# Patient Record
Sex: Male | Born: 1960 | Race: White | Hispanic: No | Marital: Married | Smoking: Former smoker
Health system: Southern US, Community
[De-identification: ages and names within clinical notes are randomized; demographics above are authoritative.]

## PROBLEM LIST (undated history)

## (undated) DIAGNOSIS — Z8249 Family history of ischemic heart disease and other diseases of the circulatory system: Secondary | ICD-10-CM

## (undated) DIAGNOSIS — G4730 Sleep apnea, unspecified: Secondary | ICD-10-CM

## (undated) DIAGNOSIS — Z87442 Personal history of urinary calculi: Secondary | ICD-10-CM

## (undated) DIAGNOSIS — E785 Hyperlipidemia, unspecified: Secondary | ICD-10-CM

## (undated) DIAGNOSIS — E8881 Metabolic syndrome: Secondary | ICD-10-CM

## (undated) DIAGNOSIS — G4733 Obstructive sleep apnea (adult) (pediatric): Secondary | ICD-10-CM

## (undated) DIAGNOSIS — R7302 Impaired glucose tolerance (oral): Secondary | ICD-10-CM

## (undated) DIAGNOSIS — Z87891 Personal history of nicotine dependence: Secondary | ICD-10-CM

## (undated) DIAGNOSIS — R55 Syncope and collapse: Secondary | ICD-10-CM

## (undated) DIAGNOSIS — I2510 Atherosclerotic heart disease of native coronary artery without angina pectoris: Secondary | ICD-10-CM

## (undated) DIAGNOSIS — N200 Calculus of kidney: Secondary | ICD-10-CM

## (undated) DIAGNOSIS — E669 Obesity, unspecified: Secondary | ICD-10-CM

## (undated) DIAGNOSIS — F17210 Nicotine dependence, cigarettes, uncomplicated: Secondary | ICD-10-CM

## (undated) DIAGNOSIS — I10 Essential (primary) hypertension: Secondary | ICD-10-CM

## (undated) DIAGNOSIS — I700 Atherosclerosis of aorta: Secondary | ICD-10-CM

## (undated) DIAGNOSIS — R918 Other nonspecific abnormal finding of lung field: Secondary | ICD-10-CM

## (undated) DIAGNOSIS — J45909 Unspecified asthma, uncomplicated: Secondary | ICD-10-CM

## (undated) HISTORY — PX: KNEE SURGERY: SHX244

## (undated) HISTORY — DX: Hyperlipidemia, unspecified: E78.5

## (undated) HISTORY — DX: Obesity, unspecified: E66.9

## (undated) HISTORY — DX: Syncope and collapse: R55

## (undated) HISTORY — DX: Family history of ischemic heart disease and other diseases of the circulatory system: Z82.49

## (undated) HISTORY — DX: Nicotine dependence, cigarettes, uncomplicated: F17.210

## (undated) HISTORY — DX: Impaired glucose tolerance (oral): R73.02

## (undated) HISTORY — DX: Personal history of nicotine dependence: Z87.891

## (undated) HISTORY — DX: Atherosclerotic heart disease of native coronary artery without angina pectoris: I25.10

## (undated) HISTORY — DX: Other nonspecific abnormal finding of lung field: R91.8

## (undated) HISTORY — DX: Sleep apnea, unspecified: G47.30

## (undated) HISTORY — DX: Obstructive sleep apnea (adult) (pediatric): G47.33

## (undated) HISTORY — DX: Unspecified asthma, uncomplicated: J45.909

## (undated) HISTORY — DX: Metabolic syndrome: E88.810

## (undated) HISTORY — DX: Essential (primary) hypertension: I10

## (undated) HISTORY — DX: Atherosclerosis of aorta: I70.0

## (undated) HISTORY — DX: Calculus of kidney: N20.0

## (undated) HISTORY — DX: Metabolic syndrome: E88.81

## (undated) HISTORY — PX: COLONOSCOPY: SHX174

---

## 2006-02-19 ENCOUNTER — Ambulatory Visit: Payer: Self-pay | Admitting: Family Medicine

## 2012-06-10 ENCOUNTER — Ambulatory Visit: Payer: Self-pay | Admitting: Gastroenterology

## 2013-03-27 HISTORY — PX: OTHER SURGICAL HISTORY: SHX169

## 2013-04-01 ENCOUNTER — Encounter: Payer: Self-pay | Admitting: *Deleted

## 2013-04-02 ENCOUNTER — Ambulatory Visit (INDEPENDENT_AMBULATORY_CARE_PROVIDER_SITE_OTHER): Payer: No Typology Code available for payment source | Admitting: Cardiology

## 2013-04-02 ENCOUNTER — Encounter (INDEPENDENT_AMBULATORY_CARE_PROVIDER_SITE_OTHER): Payer: Self-pay

## 2013-04-02 ENCOUNTER — Encounter: Payer: Self-pay | Admitting: Cardiology

## 2013-04-02 VITALS — BP 147/102 | HR 60 | Ht 70.0 in | Wt 244.0 lb

## 2013-04-02 DIAGNOSIS — Z8249 Family history of ischemic heart disease and other diseases of the circulatory system: Secondary | ICD-10-CM | POA: Insufficient documentation

## 2013-04-02 DIAGNOSIS — I10 Essential (primary) hypertension: Secondary | ICD-10-CM

## 2013-04-02 DIAGNOSIS — E785 Hyperlipidemia, unspecified: Secondary | ICD-10-CM | POA: Insufficient documentation

## 2013-04-02 DIAGNOSIS — R0989 Other specified symptoms and signs involving the circulatory and respiratory systems: Secondary | ICD-10-CM

## 2013-04-02 DIAGNOSIS — F17200 Nicotine dependence, unspecified, uncomplicated: Secondary | ICD-10-CM

## 2013-04-02 DIAGNOSIS — E669 Obesity, unspecified: Secondary | ICD-10-CM

## 2013-04-02 DIAGNOSIS — E8881 Metabolic syndrome: Secondary | ICD-10-CM | POA: Insufficient documentation

## 2013-04-02 DIAGNOSIS — R55 Syncope and collapse: Secondary | ICD-10-CM

## 2013-04-02 DIAGNOSIS — Z8679 Personal history of other diseases of the circulatory system: Secondary | ICD-10-CM | POA: Insufficient documentation

## 2013-04-02 DIAGNOSIS — R0609 Other forms of dyspnea: Secondary | ICD-10-CM

## 2013-04-02 DIAGNOSIS — F17210 Nicotine dependence, cigarettes, uncomplicated: Secondary | ICD-10-CM

## 2013-04-02 HISTORY — DX: Hyperlipidemia, unspecified: E78.5

## 2013-04-02 HISTORY — DX: Nicotine dependence, cigarettes, uncomplicated: F17.210

## 2013-04-02 HISTORY — DX: Family history of ischemic heart disease and other diseases of the circulatory system: Z82.49

## 2013-04-02 HISTORY — DX: Obesity, unspecified: E66.9

## 2013-04-02 NOTE — Progress Notes (Signed)
PATIENT: Edward Bean MRN: [id number] DOB: [birth year] PCP: Golden Pop, MD  Clinic Note: Chief Complaint  Patient presents with  . other    Ref by Jeananne Rama due to syncope c/o sob with exertion and dizziness. Meds reviewed verbally with pt.,   HPI: Edward Bean is a 53 y.o. male with a PMH family history of premature CAD with both parents having a heart attack at age 35) below who presents today for evaluation of an episode of syncope.   Interval History: At baseline, he is relatively asymptomatic with no symptoms whatsoever of exertional chest pressure chest tightness. No palpitations or lightheadedness, dizziness loses her near-syncope. No PND, orthopnea edema. He was in his regular state of health until Saturday evening (January 3) when HEENT his wife were at a friend's house celebrating all at Christmas. His celebration involved like hors d'oeuvres and he noted having at least 3 beers and then 3 relatively stiff mixed drinks. He also does not having really have that much to drink as far as regular nonalcoholic beverages that day. He initially conferred that this is not a significant amount on-call for him, but his wife argue did it was more than usual, and they did not eat that much. After several hours of festivities, he began feeling flushed, and hot. He said he just did not feel right all of the dizzy. He got up to go to the bathroom to splash water on his face which initially made it feel better but the a wave of flushing and an queasiness to her him so walked outside for fresh air and initially felt better with a fresh air but then when she walked back inside when he was on he felt "foggy and like he was walking with vision." Before he realized it he falls floor. His wife described him being totally unresponsive for several minutes. She could not tell if his breathing, and said he looked pale and it shouldn't. After several minutes of difficult to arouse, he initially came back around. They  put it inside the friend's house which is an unfamiliar environment grams was initially confused but then quickly cannulated denies any postictal type symptoms. He then asked to sit up, and upon doing so again felt queasy and a little been nauseated so he lay back down and proceeded to have small amount of throwing up mostly liquids. Shortly after that he passed out again this time for much less time. During the first event EMS was called, and they arrived shortly after he came back around the second time. He describes feeling like he was "gasping for air before his event. "  Upon EMS arrival he was initially noted to have low blood pressures, normal blood sugars. When they help him sit up, he asked for a glass of water and after drinking less water felt much better. He decided not to go to emergency room.  Importantly, at no time during these events did feel chest tightness or pressure, dyspnea or irregular heartbeat/rapid heartbeats. He described it more as a feeling of the world spinning around little bit like back when he was in college and was getting to back to his room, knowing that he was drunk. The world would start spinning around.  Past Medical History  Diagnosis Date  . Sleep apnea   . Syncope   . Asthma     as child  . Obesity (BMI 30-39.9) 04/02/2013  . Smoking greater than 10 pack years 04/02/2013  . Hyperlipidemia LDL goal <130 04/02/2013  .  Family history of premature CAD 04/02/2013  . Hypertension, essential   . OSA (obstructive sleep apnea)     Not currently on CPAP; clinical diagnosis by PCP  . Metabolic syndrome     Obese; HLD (TG 243), Low HDL (34), HTN  . Impaired glucose tolerance in obese     Prior Cardiac Evaluation and Past Surgical History: Past Surgical History  Procedure Laterality Date  . Knee surgery      Extensive L Knee surgery    Allergies  Allergen Reactions  . Shrimp [Shellfish Allergy]     Itching and rash    Current Outpatient Prescriptions    Medication Sig Dispense Refill  . Aspirin-Salicylamide-Caffeine (BC HEADACHE POWDER PO) Take by mouth as needed.       No current facility-administered medications for this visit.    History   Social History Narrative   Married Father of 1 daughter.    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  Prior to that, smoked off & on for ~20+ years.   Drinks occasional social alcohol - moderate; on weekends   Occasionally walks / does light exercise.          family history includes Esophageal cancer in his mother; Heart attack (age of onset: 58) in his father and mother; Hyperlipidemia in his father; Hypertension in his mother; Lung cancer in his father.  ROS: A comprehensive Review of Systems - Negative except Pertinent symptoms in HPI & otherwise noted below. Respiratory ROS: positive for - exertional dyspnea negative for - cough, hemoptysis, orthopnea, pleuritic pain, sputum changes, stridor, tachypnea or wheezing Musculoskeletal ROS: positive for - joint pain, joint stiffness and L> R knee; limits ambulation.     PHYSICAL EXAM BP 147/102  Pulse 60  Ht 5\' 10"  (1.778 m)  Wt 244 lb (110.678 kg)  BMI 35.01 kg/m2 Orthostatic blood pressure checks: Supine: Heart rate 60, blood pressure 136/90 mmHg; sitting: Heart rate 62, blood pressure 131/87 mmHg. Standing heart rate 65, blood pressure 152/96 mmHg --> after 5 minutes: Heart rate 75, blood pressure 136/95 mmHg. ---> Negative orthostasis. General appearance: alert, cooperative, no distress and mildly obese HEENT: [place]/AT, EOMI, MMM, anicteric sclera Neck: no adenopathy, no carotid bruit, no JVD, supple, symmetrical, trachea midline and thyroid not enlarged, symmetric, no tenderness/mass/nodules Lungs: clear to auscultation bilaterally, normal percussion bilaterally and non-labored, good air movement Heart: regular rate and rhythm, S1, S2 normal, no murmur, click, rub or gallop and  normal apical impulse Abdomen: soft, non-tender; bowel sounds normal; no masses,  no organomegaly Extremities: extremities normal, atraumatic, no cyanosis or edema; L knee with multiple surgical scars & significant crepitance Pulses: 2+ and symmetric Neurologic: Alert and oriented X 3, normal strength and tone. Normal symmetric reflexes. Normal coordination and gait  [id number] today: Yes Rate:60 , Rhythm: NSR; Normal EKG EKG from PCP sinus bradycardia, rate 59. Otherwise normal.  Recent Labs: April 01, 2013  UA trace hematuria otherwise normal. CBC normal with WBC 9.6, and H&H was 15.1/40.6 platelets 242  Chemistries: Sodium 141, potassium 4.2, chloride 101, bicarbonate 25, BUN 13, creatinine 0.6, glucose 97; calcium 9.4. LFTs were normal.  Lipid panel: TC 207, TG 235, HDL 34, LDL 126.  TSH 1.76.  ASSESSMENT / PLAN:  Syncope and collapse Overall impression of this event is that he probably had more to do with excessive alcohol intake with not enough food as well as pre-hydration. He describes symptoms that  sound more like dehydration symptoms. I suspect he simply blacked out from alcohol intoxication and dehydration.  Although this is the leading diagnosis, we discussed other potential etiologies. The lack of any significant cardiac findings on exam or ECG was suggested that there is no structural abnormality of the heart. However in a 54 year old gentleman with features that would be consistent with metabolic syndrome and has a significant family history of premature CAD, it is prudent to exclude potentially lethal/potentially ischemic cardiac arrhythmias. Certainly hypomagnesemia could also be a potential etiology for a Ventricular Tachycardia episode of Torsades.  Plan: Treadmill Myoview Stress Test. Probably deserves this factors, and also because of his significant arthritic pains in his left knee, he is not sure he'll be on a make it to target heart rate on the treadmill.  Therefore I would prefer to use a nuclear imaging scan which will allow for the potential to convert to [place].  Hypertension, essential The blood pressure is elevated today. It was last dilated on orthostatic checks. For now I would prefer to wait until we see the results of stress test before making any recommendations for blood pressure regimen. I don't think that a diuretic would be a good idea based on the leading diagnosis for syncope.  Hyperlipidemia LDL goal <130 Currently he he is right borderline between recommendations being LDL less than 130 and LDL less than 100. Depending on the results of this stress test, we may become more vigilant in management of his dyslipidemia. As in the setting of what appears to be metabolic syndrome with impaired glucose intolerance and obesity as well as hypertension and low HDL.  Metabolic syndrome He does meet criteria with truncal obesity, low HDL, high triglycerides and hypertension. He also has reported glucose intolerance by previous labs. Close monitoring and evaluation is very important, I think it for now dietary modification increasing exercise or subluxation of the key complements of his respective modification.  Smoking greater than 10 pack years He says that he has quit smoking as of this weekend, PCO2 [place] without significant cravings. Continue to monitor I congratulated him on his intentions. I offered support and recommendations as far as options using the patches are atrophic cigarettes. He seems to feel like he can do it without adjunctive therapy.  Dyspnea on exertion The one symptom that is a little bit concerning for symptoms prior to this particular syncopal event is the exertional dyspnea. He states that in the past he was diagnosed with COPD but it is not listed as a diagnosis, and a smoking history is not excessive. He doesn't have any exam findings it was suggested either.    Orders Placed This Encounter  Procedures  .  Myocardial Perfusion Imaging    Standing Status: Future     Number of Occurrences:      Standing Expiration Date: 04/02/2014    Scheduling Instructions:     To be performed at [facility]    Order Specific Question:  Where should this test be performed    Answer:  Other    Order Specific Question:  Type of stress    Answer:  Exercise    Order Specific Question:  Patient weight in lbs    Answer:  244  . EKG 12-Lead    Order Specific Question:  Where should this test be performed    Answer:  [facility]   Meds ordered this encounter  Medications  . Aspirin-Salicylamide-Caffeine (BC HEADACHE POWDER PO)    Sig: Take by mouth as  needed.   Followup: 2 weeks.  DAVID W. Ellyn Hack, M.D., M.S. [facility] [address] [place], [place]  [address]  [phone] Pager # [phone]

## 2013-04-02 NOTE — Patient Instructions (Addendum)
Edward Bean  Your caregiver has ordered a Stress Test with nuclear imaging. The purpose of this test is to evaluate the blood supply to your heart muscle. This procedure is referred to as a "Non-Invasive Stress Test." This is because other than having an IV started in your vein, nothing is inserted or "invades" your body. Cardiac stress tests are done to find areas of poor blood flow to the heart by determining the extent of coronary artery disease (CAD). Some patients exercise on a treadmill, which naturally increases the blood flow to your heart, while others who are  unable to walk on a treadmill due to physical limitations have a pharmacologic/chemical stress agent called Lexiscan . This medicine will mimic walking on a treadmill by temporarily increasing your coronary blood flow.   Please note: these test may take anywhere between 2-4 hours to complete  PLEASE REPORT TO [facility] AT THE FIRST DESK WILL DIRECT YOU WHERE TO GO  Date of Procedure:____________1/12/15_________________________  Arrival Time for Procedure:________07:45am______________________  Instructions regarding medication:    PLEASE NOTIFY THE OFFICE AT LEAST 24 HOURS IN ADVANCE IF YOU ARE UNABLE TO KEEP YOUR APPOINTMENT.  [phone] AND  PLEASE NOTIFY NUCLEAR MEDICINE AT [facility] AT LEAST 24 HOURS IN ADVANCE IF YOU ARE UNABLE TO KEEP YOUR APPOINTMENT. [phone]  How to prepare for your Myoview test:  1. Do not eat or drink after midnight 2. No caffeine for 24 hours prior to test 3. No smoking 24 hours prior to test. 4. Your medication may be taken with water.  If your doctor stopped a medication because of this test, do not take that medication. 5. Ladies, please do not wear dresses.  Skirts or pants are appropriate. Please wear a short sleeve shirt. 6. No perfume, cologne or lotion. 7. Wear comfortable walking shoes. No heels!   Please stay hydrated  Please monitor your blood  pressure     Follow up 2 weeks after Myoview

## 2013-04-02 NOTE — Assessment & Plan Note (Signed)
The one symptom that is a little bit concerning for symptoms prior to this particular syncopal event is the exertional dyspnea. He states that in the past he was diagnosed with COPD but it is not listed as a diagnosis, and a smoking history is not excessive. He doesn't have any exam findings it was suggested either.

## 2013-04-02 NOTE — Assessment & Plan Note (Signed)
He does meet criteria with truncal obesity, low HDL, high triglycerides and hypertension. He also has reported glucose intolerance by previous labs. Close monitoring and evaluation is very important, I think it for now dietary modification increasing exercise or subluxation of the key complements of his respective modification.

## 2013-04-02 NOTE — Assessment & Plan Note (Signed)
Overall impression of this event is that he probably had more to do with excessive alcohol intake with not enough food as well as pre-hydration. He describes symptoms that sound more like dehydration symptoms. I suspect he simply blacked out from alcohol intoxication and dehydration.  Although this is the leading diagnosis, we discussed other potential etiologies. The lack of any significant cardiac findings on exam or ECG was suggested that there is no structural abnormality of the heart. However in a 53 year old gentleman with features that would be consistent with metabolic syndrome and has a significant family history of premature CAD, it is prudent to exclude potentially lethal/potentially ischemic cardiac arrhythmias. Certainly hypomagnesemia could also be a potential etiology for a Ventricular Tachycardia episode of [place]ors[place].  Plan: Treadmill Myoview Stress Test. Probably deserves this factors, and also because of his significant arthritic pains in his left knee, he is not sure he'll be on a make it to target heart rate on the treadmill. Therefore I would prefer to use a nuclear imaging scan which will allow for the potential to convert to [place].

## 2013-04-02 NOTE — Assessment & Plan Note (Signed)
Currently he he is right borderline between recommendations being LDL less than 130 and LDL less than 100. Depending on the results of this stress test, we may become more vigilant in management of his dyslipidemia. As in the setting of what appears to be metabolic syndrome with impaired glucose intolerance and obesity as well as hypertension and low HDL.

## 2013-04-02 NOTE — Assessment & Plan Note (Signed)
He says that he has quit smoking as of this weekend, PCO2 [place] without significant cravings. Continue to monitor I congratulated him on his intentions. I offered support and recommendations as far as options using the patches are atrophic cigarettes. He seems to feel like he can do it without adjunctive therapy.

## 2013-04-02 NOTE — Assessment & Plan Note (Signed)
The blood pressure is elevated today. It was last dilated on orthostatic checks. For now I would prefer to wait until we see the results of stress test before making any recommendations for blood pressure regimen. I don't think that a diuretic would be a good idea based on the leading diagnosis for syncope.

## 2013-04-04 ENCOUNTER — Telehealth: Payer: Self-pay | Admitting: Cardiology

## 2013-04-04 ENCOUNTER — Other Ambulatory Visit: Payer: Self-pay

## 2013-04-04 NOTE — Telephone Encounter (Signed)
Call [facility] [place] and spoke a nurse, she's will handle the order for Mr Edward Bean for Monday Jan 12th

## 2013-04-04 NOTE — Telephone Encounter (Signed)
Rob is calling for an order to be faxed over to him for this patient stress test on Monday . States that they are not in Epic and need the order to be faxed .Marland KitchenThanks

## 2013-04-04 NOTE — Telephone Encounter (Signed)
Need an order for his stress test that is going to have Monday-04-07-13 at 8:00.Need this today please. Please fax to-[phone]-Att:Rob

## 2013-04-07 ENCOUNTER — Other Ambulatory Visit: Payer: Self-pay

## 2013-04-07 ENCOUNTER — Ambulatory Visit: Payer: Self-pay

## 2013-04-07 DIAGNOSIS — R0609 Other forms of dyspnea: Secondary | ICD-10-CM

## 2013-04-07 DIAGNOSIS — R55 Syncope and collapse: Secondary | ICD-10-CM

## 2013-04-07 DIAGNOSIS — R0602 Shortness of breath: Secondary | ICD-10-CM

## 2013-04-07 NOTE — Progress Notes (Signed)
Quick Note:  Stress Test looked good!! No sign of significant Heart Artery Disease. Pump function is normal.  Good news!!.  HARDING,DAVID W, MD  ______ 

## 2013-04-10 NOTE — Progress Notes (Signed)
Notified patient per Dr. Leslie Andrea looks good with no significant heart disease with normal heart function.

## 2013-04-23 ENCOUNTER — Encounter: Payer: Self-pay | Admitting: Cardiology

## 2013-04-23 ENCOUNTER — Ambulatory Visit (INDEPENDENT_AMBULATORY_CARE_PROVIDER_SITE_OTHER): Payer: No Typology Code available for payment source | Admitting: Cardiology

## 2013-04-23 VITALS — BP 120/80 | HR 62 | Ht 72.0 in | Wt 235.2 lb

## 2013-04-23 DIAGNOSIS — E8881 Metabolic syndrome: Secondary | ICD-10-CM

## 2013-04-23 DIAGNOSIS — R55 Syncope and collapse: Secondary | ICD-10-CM

## 2013-04-23 DIAGNOSIS — F17210 Nicotine dependence, cigarettes, uncomplicated: Secondary | ICD-10-CM

## 2013-04-23 DIAGNOSIS — I10 Essential (primary) hypertension: Secondary | ICD-10-CM

## 2013-04-23 DIAGNOSIS — E669 Obesity, unspecified: Secondary | ICD-10-CM

## 2013-04-23 DIAGNOSIS — F17200 Nicotine dependence, unspecified, uncomplicated: Secondary | ICD-10-CM

## 2013-04-23 DIAGNOSIS — E785 Hyperlipidemia, unspecified: Secondary | ICD-10-CM

## 2013-04-23 NOTE — Patient Instructions (Addendum)
Your stress test was normal.  Your physician recommends that you continue on your current medications as directed.  Please refer to the Current Medication list given to you today.  Your physician wants you to follow-up in: 1 year.  You will receive a reminder letter in the mail two months in advance.  If you don't receive a letter, please call our office to schedule the follow-up appointment.

## 2013-04-25 ENCOUNTER — Encounter: Payer: Self-pay | Admitting: Cardiology

## 2013-04-25 NOTE — Assessment & Plan Note (Addendum)
He is doing outstandingly well. He is now almost 4 weeks since his last cigarette. I congratulated him and encourage his efforts.

## 2013-04-25 NOTE — Progress Notes (Signed)
PATIENT: Edward Bean MRN: [id number]  DOB: February 24, 1961   DOV:04/25/2013 PCP: Golden Pop, MD  Clinic Note: Chief Complaint  Patient presents with  . other    F/u from [facility] no complaints. Meds reviewed verbally.    HPI: Edward Bean is a 53 y.o.  male with a PMH below who presents today for followup of his nuclear stress test was ordered in response to a concerning episode with frank syncope as well as exertional dyspnea. Edward Bean had an episode where he passed out while at a friend's house for celebration. He had had quite a bit of alcohol drinking and perhaps not enough hydration. A concerning feature was that he had been noticing some dyspnea associated with it. She was evaluated with a treadmill Myoview. He did outstandingly well on his Myoview reaching 12 metabolic once with no EKG or scintigraphic evidence of ischemia.  Interval History: Since his last visit just 3 weeks ago, he has lost 11 pounds, by increasing exercise and dietary modification. He is also successfully abstained from cigarettes for the last 3 weeks and plans to make this a permanent change. He says that with the weight loss and not smoking, he no longer had he not distended he had before. He feels much better and is not getting short of breath and over this a shoe laces. The remainder of his cardiac review of systems are as follows:  No chest pain or shortness of breath with rest or exertion.   No PND, orthopnea or edema.   No palpitations, lightheadedness, dizziness, weakness or syncope/near syncope.  No TIA/amaurosis fugax symptoms.  No melena, hematochezia hematuria.  No claudication  Past Medical History  Diagnosis Date  . Sleep apnea   . Syncope   . Asthma     as child  . Obesity (BMI 30-39.9) 04/02/2013  . Smoking greater than 10 pack years 04/02/2013  . Hyperlipidemia LDL goal <130 04/02/2013  . Family history of premature CAD 04/02/2013  . Hypertension, essential   . OSA (obstructive sleep apnea)    Not currently on CPAP; clinical diagnosis by PCP  . Metabolic syndrome     Obese; HLD (TG 243), Low HDL (34), HTN  . Impaired glucose tolerance in obese     Prior Cardiac Evaluation and Past Surgical History: Past Surgical History  Procedure Laterality Date  . Knee surgery      Extensive L Knee surgery  . Treadmill myoview  January 12 0.15    Exercise 10 min; 12 METS (excellent exercise tolerance)) - read 169 bpm (101% of max predicted) - no ECG or scintigraphic evidence of ischemia or infarction    Allergies  Allergen Reactions  . Shrimp [Shellfish Allergy]     Itching and rash    Current Outpatient Prescriptions  Medication Sig Dispense Refill  . Aspirin-Salicylamide-Caffeine (BC HEADACHE POWDER PO) Take by mouth as needed.       No current facility-administered medications for this visit.    History   Social History Narrative   Married Father of 1 daughter.    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  Prior to that, smoked off & on for ~20+ years.   Drinks occasional social alcohol - moderate; on weekends   Occasionally walks / does light exercise.          ROS: A comprehensive Review of Systems - Negative except Mild musculoskeletal arthralgias. is some stiffness his left knee that is  really been calling up for walking in the past but he's been tolerating it okay.  PHYSICAL EXAM BP 120/80  Pulse 62  Ht 6' (1.829 m)  Wt 235 lb 4 oz (106.709 kg)  BMI 31.90 kg/m2  Filed Weights    04/02/2013  04/23/13 0959  Weight:  244 pounds (110.678 kg)  235 lb 4 oz (106.709 kg)   General appearance: Alert and oriented X 3, cooperative, no distress and mildly obese , but notably lighter than last visit. HEENT: Edward Bean/AT, EOMI, MMM, anicteric sclera  Neck: Supple, no LAN, carotid bruit, or JVD  Lungs: CTA B., normal percussion bilaterally and non-labored, good air movement  Heart: regular rate and rhythm, S1, S2 normal, no  murmur, click, rub or gallop and normal apical impulse  Abdomen: soft, non-tender; bowel sounds normal; no masses, no organomegaly  Extremities: no cyanosis or edema; Pulses: 2+ and symmetric  Neurologic: Normal strength and tone. Normal symmetric reflexes. Normal coordination and gait  OVF:[id number] today: No  Recent Labs: No  ASSESSMENT / PLAN: Syncope and collapse Based on the fact that he had a very negative Myoview, I doubt that this is a cardiac arrhythmia. Most likely was related to dehydration with not eating well and alcohol. The dressing about this episode as it is had a positive effect on him as far as a wakeup call for getting control of his health. He has lost significant weight and quit smoking all his result of this episode.  Hypertension, essential Excellent control today. Not on any medications, and would avoid starting one now.  Hyperlipidemia LDL goal <130 Current lesions could still be LDL less than 130. I suspect that if he were to continue losing weight and exercising, he may well not need any additional therapy.  Obesity (BMI 30-39.9) Excellent weight loss as described above. He continues to try to strive for weight loss. The plan would be to lose a total of 24 pounds for this year. The following year would be a goal of about 15-20 pounds which occluded close to his weight during his young adulthood.  Smoking greater than 10 pack years He is doing outstandingly well. He is now almost 4 weeks since his last cigarette. I congratulated him and encourage his efforts.  Metabolic syndrome Hoping that with the weight loss he will start losing criteria for metabolic syndrome. I would anticipate improved lipid profile as well as blood pressure.   Continue current medical regimen and diet/exercise.  No orders of the defined types were placed in this encounter.   No orders of the defined types were placed in this encounter.    Followup: One year  Bleu Minerd W. Ellyn Hack,  M.D., M.S. [facility] [address] [place], [place]  [address]  [phone] Pager # [phone]

## 2013-04-25 NOTE — Assessment & Plan Note (Signed)
Current lesions could still be LDL less than 130. I suspect that if he were to continue losing weight and exercising, he may well not need any additional therapy.

## 2013-04-25 NOTE — Assessment & Plan Note (Signed)
Hoping that with the weight loss he will start losing criteria for metabolic syndrome. I would anticipate improved lipid profile as well as blood pressure.

## 2013-04-25 NOTE — Assessment & Plan Note (Signed)
Based on the fact that he had a very negative Myoview, I doubt that this is a cardiac arrhythmia. Most likely was related to dehydration with not eating well and alcohol. The dressing about this episode as it is had a positive effect on him as far as a wakeup call for getting control of his health. He has lost significant weight and quit smoking all his result of this episode.

## 2013-04-25 NOTE — Assessment & Plan Note (Signed)
Excellent control today. Not on any medications, and would avoid starting one now.

## 2013-04-25 NOTE — Assessment & Plan Note (Signed)
Excellent weight loss as described above. He continues to try to strive for weight loss. The plan would be to lose a total of 24 pounds for this year. The following year would be a goal of about 15-20 pounds which occluded close to his weight during his young adulthood.

## 2015-02-10 ENCOUNTER — Encounter: Payer: Self-pay | Admitting: Family Medicine

## 2015-02-10 ENCOUNTER — Ambulatory Visit (INDEPENDENT_AMBULATORY_CARE_PROVIDER_SITE_OTHER): Payer: PRIVATE HEALTH INSURANCE | Admitting: Family Medicine

## 2015-02-10 VITALS — BP 124/78 | HR 97 | Temp 98.4°F | Resp 16 | Ht 71.0 in | Wt 223.7 lb

## 2015-02-10 DIAGNOSIS — F17210 Nicotine dependence, cigarettes, uncomplicated: Secondary | ICD-10-CM | POA: Diagnosis not present

## 2015-02-10 DIAGNOSIS — E785 Hyperlipidemia, unspecified: Secondary | ICD-10-CM

## 2015-02-10 DIAGNOSIS — Z125 Encounter for screening for malignant neoplasm of prostate: Secondary | ICD-10-CM | POA: Diagnosis not present

## 2015-02-10 DIAGNOSIS — G44219 Episodic tension-type headache, not intractable: Secondary | ICD-10-CM | POA: Insufficient documentation

## 2015-02-10 DIAGNOSIS — R7303 Prediabetes: Secondary | ICD-10-CM | POA: Diagnosis not present

## 2015-02-10 NOTE — Progress Notes (Signed)
Name: Edward Bean   MRN: [id number]    DOB: March 01, 1961   Date:02/10/2015       Progress Note  Subjective  Chief Complaint  Chief Complaint  Patient presents with  . Establish Care    HPI  Edward Bean is a 54 y.o. male here today to transition care of medical needs to a primary care provider. He had a syncopal episode over a year ago and subsequent stress testing was negative. He implemented lifestyle changes to lower his cholesterol and blood pressure. He did however pick up smoking cigarettes again, he enjoys it, but knows it is not a healthy choice and will work on quitting on his own. He has quit successfully several times before anywhere from 8 months to 3 years duration. 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 Relieved with NSAID. No other associated symptoms.     Past Medical History  Diagnosis Date  . Sleep apnea   . Syncope   . Asthma     as child  . Obesity (BMI 30-39.9) 04/02/2013  . Smoking greater than 10 pack years 04/02/2013  . Hyperlipidemia LDL goal <130 04/02/2013  . Family history of premature CAD 04/02/2013  . Hypertension, essential   . OSA (obstructive sleep apnea)     Not currently on CPAP; clinical diagnosis by PCP  . Metabolic syndrome     Obese; HLD (TG 243), Low HDL (34), HTN  . Impaired glucose tolerance in obese     Patient Active Problem List   Diagnosis Date Noted  . Syncope and collapse 04/02/2013  . Dyspnea on exertion 04/02/2013  . Hyperlipidemia LDL goal <130 04/02/2013  . Family history of premature CAD 04/02/2013  . Smoking greater than 10 pack years 04/02/2013  . Obesity (BMI 30-39.9) 04/02/2013  . Hypertension, essential   . Metabolic syndrome     Social History  Substance Use Topics  . Smoking status: Current Some Day Smoker -- 0.50 packs/day for 10 years    Types: Cigarettes     Last Attempt to Quit: 03/31/2013  . Smokeless tobacco: Former Systems developer    Quit date: 03/24/2013     Comment: Smoked off & on for ~20 yrs prior to the last ~10 yr spell  . Alcohol Use: 0.0 oz/week    0 Standard drinks or equivalent per week     Comment: ocassional; usually not bothered by 2-3 beers +/- mixed drinks     Current outpatient prescriptions:  .  Aspirin-Salicylamide-Caffeine (BC HEADACHE POWDER PO), Take by mouth as needed., Disp: , Rfl:   Past Surgical History  Procedure Laterality Date  . Knee surgery      Extensive L Knee surgery  . Treadmill myoview  January 12 0.15    Exercise 10 min; 12 METS (excellent exercise tolerance)) - read 169 bpm (101% of max predicted) - no ECG or scintigraphic evidence of ischemia or infarction    Family History  Problem Relation Age of Onset  . Esophageal cancer Mother   . Heart attack Mother 7  . Hypertension Mother   . Lung cancer Father     Long term smoker  . Heart attack Father 22    MI -> ~12 yrs later --> CABG  . Hyperlipidemia Father     Allergies  Allergen Reactions  . Shrimp [employer]  Allergy]     Itching and rash     Review of Systems  CONSTITUTIONAL: No significant weight changes, fever, chills, weakness or fatigue.  HEENT:  - Eyes: No visual changes.  - Ears: No auditory changes. No pain.  - Nose: No sneezing, congestion, runny nose. - Throat: No sore throat. No changes in swallowing. SKIN: No rash or itching.  CARDIOVASCULAR: No chest pain, chest pressure or chest discomfort. No palpitations or edema.  RESPIRATORY: No shortness of breath, cough or sputum.  GASTROINTESTINAL: No anorexia, nausea, vomiting. No changes in bowel habits. No abdominal pain or blood.  GENITOURINARY: No dysuria. No frequency. No discharge. NEUROLOGICAL: Yes headache. No dizziness, syncope, paralysis, ataxia, numbness or tingling in the extremities. No memory changes. No change in bowel or bladder control.  MUSCULOSKELETAL: No  joint pain. No muscle pain. HEMATOLOGIC: No anemia, bleeding or bruising.  LYMPHATICS: No enlarged lymph nodes.  PSYCHIATRIC: No change in mood. No change in sleep pattern.  ENDOCRINOLOGIC: No reports of sweating, cold or heat intolerance. No polyuria or polydipsia.     Objective  BP 124/78 mmHg  Pulse 97  Temp(Src) 98.4 F (36.9 C) (Oral)  Resp 16  Ht 5\' 11"  (1.803 m)  Wt 223 lb 11.2 oz (101.47 kg)  BMI 31.21 kg/m2  SpO2 97% Body mass index is 31.21 kg/(m^2).  Physical Exam  Constitutional: Patient is overweight and well-nourished. In no distress.  HEENT:  - Head: Normocephalic and atraumatic.  - Ears: Bilateral TMs gray, no erythema or effusion - Nose: Nasal mucosa moist - Mouth/Throat: Oropharynx is clear and moist. No tonsillar hypertrophy or erythema. No post nasal drainage.  - Eyes: Conjunctivae clear, EOM movements normal. PERRLA. No scleral icterus.  Neck: Normal range of motion. Neck supple. No JVD present. No thyromegaly present.  Cardiovascular: Normal rate, regular rhythm and normal heart sounds.  No murmur heard.  Pulmonary/Chest: Effort normal and breath sounds normal. No respiratory distress. Musculoskeletal: Normal range of motion bilateral UE and LE, no joint effusions. Peripheral vascular: Bilateral LE no edema. Neurological: CN II-XII grossly intact with no focal deficits. Alert and oriented to person, place, and time. Coordination, balance, strength, speech and gait are normal.  Skin: Skin is warm and dry. No rash noted. No erythema.  Psychiatric: Patient has a normal mood and affect. Behavior is normal in office today. Judgment and thought content normal in office today.  Assessment & Plan  1. Hyperlipidemia LDL goal <130 Repeat lab testing.  - CBC with Differential/Platelet - Comprehensive metabolic panel - Hemoglobin A1c - Lipid panel - PSA - TSH  2. Headache, infrequent episodic tension-type Likely tension related. Continue surveillance, if  persistent or worsening we discussed getting carotid US.   - CBC with Differential/Platelet - Comprehensive metabolic panel - Hemoglobin A1c - Lipid panel - PSA - TSH  3. Pre-diabetes  - CBC with Differential/Platelet - Comprehensive metabolic panel - Hemoglobin A1c - Lipid panel - PSA - TSH  4. Encounter for screening for malignant neoplasm of prostate  - CBC with Differential/Platelet - Comprehensive metabolic panel - Hemoglobin A1c - Lipid panel - PSA - TSH  5. Smoking greater than 10 pack years The patient has been counseled on smoking cessation benefits, goals, strategies and available over the counter and prescription medications that may help them in their efforts.  Options discussed include Nicoderm patches, Wellbutrin and Chantix.  The patient voices understanding their increased risk of cardiovascular and pulmonary diseases with continued use of tobacco products.

## 2015-02-13 LAB — COMPREHENSIVE METABOLIC PANEL
A/G RATIO: 1.7 (ref 1.1–2.5)
ALBUMIN: 4.3 g/dL (ref 3.5–5.5)
ALT: 23 IU/L (ref 0–44)
AST: 18 IU/L (ref 0–40)
Alkaline Phosphatase: 46 IU/L (ref 39–117)
BILIRUBIN TOTAL: 0.7 mg/dL (ref 0.0–1.2)
BUN / CREAT RATIO: 13 (ref 9–20)
BUN: 11 mg/dL (ref 6–24)
CHLORIDE: 102 mmol/L (ref 97–106)
CO2: 24 mmol/L (ref 18–29)
Calcium: 9.8 mg/dL (ref 8.7–10.2)
Creatinine, Ser: 0.85 mg/dL (ref 0.76–1.27)
GFR calc Af Amer: 113 mL/min/{1.73_m2} (ref >59)
GFR calc non Af Amer: 98 mL/min/{1.73_m2} (ref >59)
Globulin, Total: 2.5 g/dL (ref 1.5–4.5)
Glucose: 112 mg/dL — ABNORMAL HIGH (ref 65–99)
POTASSIUM: 4.5 mmol/L (ref 3.5–5.2)
Sodium: 139 mmol/L (ref 136–144)
Total Protein: 6.8 g/dL (ref 6.0–8.5)

## 2015-02-13 LAB — CBC WITH DIFFERENTIAL/PLATELET
BASOS: 1 %
Basophils Absolute: 0 10*3/uL (ref 0.0–0.2)
EOS (ABSOLUTE): 0.2 10*3/uL (ref 0.0–0.4)
Eos: 2 %
HEMOGLOBIN: 15.7 g/dL (ref 12.6–17.7)
Hematocrit: 45.6 % (ref 37.5–51.0)
IMMATURE GRANS (ABS): 0 10*3/uL (ref 0.0–0.1)
Immature Granulocytes: 0 %
LYMPHS ABS: 1.7 10*3/uL (ref 0.7–3.1)
LYMPHS: 24 %
MCH: 33.2 pg — AB (ref 26.6–33.0)
MCHC: 34.4 g/dL (ref 31.5–35.7)
MCV: 96 fL (ref 79–97)
MONOCYTES: 6 %
Monocytes Absolute: 0.4 10*3/uL (ref 0.1–0.9)
NEUTROS ABS: 4.7 10*3/uL (ref 1.4–7.0)
Neutrophils: 67 %
Platelets: 227 10*3/uL (ref 150–379)
RBC: 4.73 x10E6/uL (ref 4.14–5.80)
RDW: 13.3 % (ref 12.3–15.4)
WBC: 7.1 10*3/uL (ref 3.4–10.8)

## 2015-02-13 LAB — LIPID PANEL
Chol/HDL Ratio: 6.1 ratio units — ABNORMAL HIGH (ref 0.0–5.0)
Cholesterol, Total: 195 mg/dL (ref 100–199)
HDL: 32 mg/dL — ABNORMAL LOW (ref >39)
LDL CALC: 130 mg/dL — AB (ref 0–99)
TRIGLYCERIDES: 166 mg/dL — AB (ref 0–149)
VLDL Cholesterol Cal: 33 mg/dL (ref 5–40)

## 2015-02-13 LAB — PSA: PROSTATE SPECIFIC AG, SERUM: 0.5 ng/mL (ref 0.0–4.0)

## 2015-02-13 LAB — HEMOGLOBIN A1C
Est. average glucose Bld gHb Est-mCnc: 126 mg/dL
Hgb A1c MFr Bld: 6 % — ABNORMAL HIGH (ref 4.8–5.6)

## 2015-02-13 LAB — TSH: TSH: 1.33 u[IU]/mL (ref 0.450–4.500)

## 2015-02-15 ENCOUNTER — Telehealth: Payer: Self-pay

## 2015-02-15 NOTE — Telephone Encounter (Signed)
Patient returned my call so I can review his labs. A copy was mailed to his home address.

## 2015-02-15 NOTE — Telephone Encounter (Signed)
I tried to contact this patient to review the results from his labs (Dr. Nadine Counts stated, "Let patient know that labs were normal other than elevated Triglyceride cholesterol and LDL cholesterol, also prediabetes Hba1c 6.0%. Continue working on lifestyle changes and smoking cessation. If he is interested in starting prescription medication to help lower some of his numbers please let me know. Otherwise I advise started fish oil capsules OTC to help lower triglycerides."), but there was no answer.  A message was left for him to give [place] a call when he got the chance.

## 2015-04-12 ENCOUNTER — Encounter: Payer: PRIVATE HEALTH INSURANCE | Admitting: Family Medicine

## 2015-04-13 ENCOUNTER — Encounter: Payer: PRIVATE HEALTH INSURANCE | Admitting: Family Medicine

## 2015-05-05 ENCOUNTER — Ambulatory Visit (INDEPENDENT_AMBULATORY_CARE_PROVIDER_SITE_OTHER): Payer: Managed Care, Other (non HMO) | Admitting: Family Medicine

## 2015-05-05 ENCOUNTER — Other Ambulatory Visit: Payer: Self-pay | Admitting: Family Medicine

## 2015-05-05 ENCOUNTER — Encounter: Payer: Self-pay | Admitting: Family Medicine

## 2015-05-05 VITALS — BP 118/78 | HR 76 | Temp 98.7°F | Resp 14 | Ht 71.0 in | Wt 217.0 lb

## 2015-05-05 DIAGNOSIS — E785 Hyperlipidemia, unspecified: Secondary | ICD-10-CM | POA: Diagnosis not present

## 2015-05-05 DIAGNOSIS — Z0000 Encounter for general adult medical examination without abnormal findings: Secondary | ICD-10-CM | POA: Insufficient documentation

## 2015-05-05 DIAGNOSIS — K439 Ventral hernia without obstruction or gangrene: Secondary | ICD-10-CM | POA: Insufficient documentation

## 2015-05-05 DIAGNOSIS — Z113 Encounter for screening for infections with a predominantly sexual mode of transmission: Secondary | ICD-10-CM | POA: Diagnosis not present

## 2015-05-05 NOTE — Progress Notes (Signed)
Name: Edward Bean   MRN: [id number]    DOB: May 17, 1960   Date:05/05/2015       Progress Note  Subjective  Chief Complaint  Chief Complaint  Patient presents with  . Annual Exam    HPI  Patient is here today for a Complete Male Physical Exam:  The patient has no acute concerns. Overall feels healthy. Diet is well balanced. 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 Has lost some weight. Mother is ill with posterior pharynx cancer (hx of esophageal cancer) and he is busy helping her to appointments. His father has lung cancer which does worry him about his own health as he does still smoke cigarettes. Some days he goes w/o any, other days he has up to 1/2 pack. In general does exercise, but irregularly. Sees dentist regularly and addresses vision concerns with ophthalmologist if applicable. In regards to sexual activity the patient is currently sexually active. Currently is not concerned about exposure to any STDs.    Lab work done some months ago showed Hypertriglyceridemia, Pre-diabetes. He has been working on weight loss and diet adjustment. Did not start fish oil therapy as instructed.  Past Medical History  Diagnosis Date  . Sleep apnea   . Syncope   . Asthma     as child  . Obesity (BMI 30-39.9) 04/02/2013  . Smoking greater than 10 pack years 04/02/2013  . Hyperlipidemia LDL goal <130 04/02/2013  . Family history of premature CAD 04/02/2013  . Hypertension, essential   . OSA (obstructive sleep apnea)     Not currently on CPAP; clinical diagnosis by PCP  . Metabolic syndrome     Obese; HLD (TG 243), Low HDL (34), HTN  . Impaired glucose tolerance in obese     Past Surgical History  Procedure Laterality Date  . Knee surgery      Extensive L Knee surgery  . Treadmill myoview  January 12 0.15    Exercise 10 min; 12 METS (excellent exercise tolerance)) - read 169 bpm (101% of max predicted) - no ECG or scintigraphic evidence of  ischemia or infarction    Family History  Problem Relation Age of Onset  . Esophageal cancer Mother   . Heart attack Mother 72  . Hypertension Mother   . Lung cancer Father     Long term smoker  . Heart attack Father 21    MI -> ~12 yrs later --> CABG  . Hyperlipidemia Father     Social History   Social History  . Marital Status: Married    Spouse Name: N/A  . Number of Children: 1  . Years of Education: N/A   Occupational History  .      Self Employed - Full time; Tree Service   Social History Main Topics  . Smoking status: Current Some Day Smoker -- 0.50 packs/day for 10 years    Types: Cigarettes    Last Attempt to Quit: 03/31/2013  . Smokeless tobacco: Former Systems developer    Quit date: 03/24/2013     Comment: Smoked off & on for ~20 yrs prior to the last ~10 yr spell  . Alcohol Use: 0.0 oz/week    0 Standard drinks or equivalent per week     Comment: ocassional; usually not bothered by 2-3 beers +/- mixed drinks  . Drug Use: No  . Sexual Activity:    Partners: Female   Other Topics Concern  . Not on file  Social History Narrative   Married Father of 1 daughter.    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  Prior to that, smoked off & on for ~20+ years.   Drinks occasional social alcohol - moderate; on weekends   Occasionally walks / does light exercise.           Current outpatient prescriptions:  .  Aspirin-Salicylamide-Caffeine (BC HEADACHE POWDER PO), Take by mouth as needed., Disp: , Rfl:   Allergies  Allergen Reactions  . Shrimp [Shellfish Allergy]     Itching and rash    ROS  CONSTITUTIONAL: No significant weight changes, fever, chills, weakness or fatigue.  HEENT:  - Eyes: No visual changes.  - Ears: No auditory changes. No pain.  - Nose: No sneezing, congestion, runny nose. - Throat: No sore throat. No changes in swallowing. SKIN: No rash or itching.  CARDIOVASCULAR: No chest pain,  chest pressure or chest discomfort. No palpitations or edema.  RESPIRATORY: No shortness of breath, cough or sputum.  GASTROINTESTINAL: No anorexia, nausea, vomiting. No changes in bowel habits. No abdominal pain or blood.  GENITOURINARY: No dysuria. No frequency. No discharge.  NEUROLOGICAL: No headache, dizziness, syncope, paralysis, ataxia, numbness or tingling in the extremities. No memory changes. No change in bowel or bladder control.  MUSCULOSKELETAL: No joint pain. No muscle pain. HEMATOLOGIC: No anemia, bleeding or bruising.  LYMPHATICS: No enlarged lymph nodes.  PSYCHIATRIC: No change in mood. No change in sleep pattern.  ENDOCRINOLOGIC: No reports of sweating, cold or heat intolerance. No polyuria or polydipsia.   Objective  Filed Vitals:   05/05/15 1103  BP: 118/78  Pulse: 76  Temp: 98.7 F (37.1 C)  TempSrc: Oral  Resp: 14  Height: 5\' 11"  (1.803 m)  Weight: 217 lb (98.431 kg)  SpO2: 97%   Body mass index is 30.28 kg/(m^2).  Depression screen [facility] 2/9 05/05/2015 02/10/2015  Decreased Interest 0 0  Down, Depressed, Hopeless 0 0  PHQ - 2 Score 0 0      Recent Results (from the past 2160 hour(s))  CBC with Differential/Platelet     Status: Abnormal   Collection Time: 02/12/15  8:04 AM  Result Value Ref Range   WBC 7.1 3.4 - 10.8 x10E3/uL   RBC 4.73 4.14 - 5.80 x10E6/uL   Hemoglobin 15.7 12.6 - 17.7 g/dL   Hematocrit 45.6 37.5 - 51.0 %   MCV 96 79 - 97 fL   MCH 33.2 (H) 26.6 - 33.0 pg   MCHC 34.4 31.5 - 35.7 g/dL   RDW 13.3 12.3 - 15.4 %   Platelets 227 150 - 379 x10E3/uL   Neutrophils 67 %   Lymphs 24 %   Monocytes 6 %   Eos 2 %   Basos 1 %   Neutrophils Absolute 4.7 1.4 - 7.0 x10E3/uL   Lymphocytes Absolute 1.7 0.7 - 3.1 x10E3/uL   Monocytes Absolute 0.4 0.1 - 0.9 x10E3/uL   EOS (ABSOLUTE) 0.2 0.0 - 0.4 x10E3/uL   Basophils Absolute 0.0 0.0 - 0.2 x10E3/uL   Immature Granulocytes 0 %   Immature Grans (Abs) 0.0 0.0 - 0.1 x10E3/uL  Comprehensive  metabolic panel     Status: Abnormal   Collection Time: 02/12/15  8:04 AM  Result Value Ref Range   Glucose 112 (H) 65 - 99 mg/dL   BUN 11 6 - 24 mg/dL   Creatinine, Ser 0.85 0.76 - 1.27 mg/dL  GFR calc non Af Amer 98 >59 mL/min/1.73   GFR calc Af Amer 113 >59 mL/min/1.73   BUN/Creatinine Ratio 13 9 - 20   Sodium 139 136 - 144 mmol/L   Potassium 4.5 3.5 - 5.2 mmol/L   Chloride 102 97 - 106 mmol/L   CO2 24 18 - 29 mmol/L   Calcium 9.8 8.7 - 10.2 mg/dL   Total Protein 6.8 6.0 - 8.5 g/dL   Albumin 4.3 3.5 - 5.5 g/dL   Globulin, Total 2.5 1.5 - 4.5 g/dL   Albumin/Globulin Ratio 1.7 1.1 - 2.5   Bilirubin Total 0.7 0.0 - 1.2 mg/dL   Alkaline Phosphatase 46 39 - 117 IU/L   AST 18 0 - 40 IU/L   ALT 23 0 - 44 IU/L  Hemoglobin A1c     Status: Abnormal   Collection Time: 02/12/15  8:04 AM  Result Value Ref Range   Hgb A1c MFr Bld 6.0 (H) 4.8 - 5.6 %    Comment:          Pre-diabetes: 5.7 - 6.4          Diabetes: >6.4          Glycemic control for adults with diabetes: <7.0    Est. average glucose Bld gHb Est-mCnc 126 mg/dL  Lipid panel     Status: Abnormal   Collection Time: 02/12/15  8:04 AM  Result Value Ref Range   Cholesterol, Total 195 100 - 199 mg/dL   Triglycerides 166 (H) 0 - 149 mg/dL   HDL 32 (L) >39 mg/dL   VLDL Cholesterol Cal 33 5 - 40 mg/dL   LDL Calculated 130 (H) 0 - 99 mg/dL   Chol/HDL Ratio 6.1 (H) 0.0 - 5.0 ratio units    Comment:                                   T. Chol/HDL Ratio                                             Men  Women                               1/2 Avg.Risk  3.4    3.3                                   Avg.Risk  5.0    4.4                                2X Avg.Risk  9.6    7.1                                3X Avg.Risk 23.4   11.0   PSA     Status: None   Collection Time: 02/12/15  8:04 AM  Result Value Ref Range   Prostate Specific Ag, Serum 0.5 0.0 - 4.0 ng/mL    Comment: Roche ECLIA methodology. According to the American Urological  Association, Serum PSA should decrease and remain at undetectable levels after  radical prostatectomy. The AUA defines biochemical recurrence as an initial PSA value 0.2 ng/mL or greater followed by a subsequent confirmatory PSA value 0.2 ng/mL or greater. Values obtained with different assay methods or kits cannot be used interchangeably. Results cannot be interpreted as absolute evidence of the presence or absence of malignant disease.   TSH     Status: None   Collection Time: 02/12/15  8:04 AM  Result Value Ref Range   TSH 1.330 0.450 - 4.500 uIU/mL    Physical Exam  Constitutional: Patient appears well-developed and well-nourished. In no distress.  HEENT:  - Head: Normocephalic and atraumatic.  - Ears: Bilateral TMs gray, no erythema or effusion - Nose: Nasal mucosa moist - Mouth/Throat: Oropharynx is clear and moist. No tonsillar hypertrophy or erythema. No post nasal drainage.  - Eyes: Conjunctivae clear, EOM movements normal. PERRLA. No scleral icterus.  Neck: Normal range of motion. Neck supple. No JVD present. No thyromegaly present.  Cardiovascular: Normal rate, regular rhythm and normal heart sounds.  No murmur heard.  Pulmonary/Chest: Effort normal and breath sounds normal. No respiratory distress. Abdominal: Soft. Midline hernia over epigastric area only with increased intra abdominal pressure. Bowel sounds are normal, no distension. There is no tenderness. no masses BREAST: Bilateral breast exam normal with no masses, skin changes or nipple discharge MALE GENITALIA: Bilateral testes descended with no masses, no penile lesions, no penile discharge. PROSTATE: Deferred Musculoskeletal: Normal range of motion bilateral UE and LE, no joint effusions. Peripheral vascular: Bilateral LE no edema. Neurological: CN II-XII grossly intact with no focal deficits. Alert and oriented to person, place, and time. Coordination, balance, strength, speech and gait are normal.  Skin: Skin  is warm and dry. No rash noted. No erythema. Scattered macules and actinic keratosis. Psychiatric: Patient has a normal mood and affect. Behavior is normal in office today. Judgment and thought content normal in office today.    Assessment & Plan  1. Annual physical exam Discussed in detail all recommended preventative measures appropriate for age and gender now and in the future.  Recommended dermatology evaluation periodically as well as low dose CT scan screening of lung cancer when he qualifies. Will need AAA screening when he qualifies.   2. Hyperlipidemia LDL goal <130 He has 2 cups of coffee with 2% mild this morning but no sugar or other foods. Will recheck FLP.  - Lipid panel  3. Screening for STD (sexually transmitted disease)  - Hepatitis C antibody - HIV antibody  4. Abdominal wall hernia Stable. Improved with weight loss.

## 2015-05-06 LAB — LIPID PANEL
CHOLESTEROL TOTAL: 181 mg/dL (ref 100–199)
Chol/HDL Ratio: 5.2 ratio units — ABNORMAL HIGH (ref 0.0–5.0)
HDL: 35 mg/dL — ABNORMAL LOW (ref >39)
LDL Calculated: 123 mg/dL — ABNORMAL HIGH (ref 0–99)
Triglycerides: 117 mg/dL (ref 0–149)
VLDL Cholesterol Cal: 23 mg/dL (ref 5–40)

## 2015-05-06 LAB — HIV ANTIBODY (ROUTINE TESTING W REFLEX): HIV Screen 4th Generation wRfx: NONREACTIVE

## 2015-05-06 LAB — HEPATITIS C ANTIBODY: Hep C Virus Ab: <0.1 s/co ratio (ref 0.0–0.9)

## 2016-02-10 ENCOUNTER — Emergency Department: Payer: Managed Care, Other (non HMO)

## 2016-02-10 ENCOUNTER — Emergency Department
Admission: EM | Admit: 2016-02-10 | Discharge: 2016-02-10 | Disposition: A | Discharge status: Home / Self Care | Payer: Managed Care, Other (non HMO) | Attending: Emergency Medicine | Admitting: Emergency Medicine

## 2016-02-10 ENCOUNTER — Encounter: Payer: Self-pay | Admitting: Emergency Medicine

## 2016-02-10 DIAGNOSIS — R109 Unspecified abdominal pain: Secondary | ICD-10-CM | POA: Diagnosis present

## 2016-02-10 DIAGNOSIS — J45909 Unspecified asthma, uncomplicated: Secondary | ICD-10-CM | POA: Insufficient documentation

## 2016-02-10 DIAGNOSIS — F17210 Nicotine dependence, cigarettes, uncomplicated: Secondary | ICD-10-CM | POA: Diagnosis not present

## 2016-02-10 DIAGNOSIS — Z7982 Long term (current) use of aspirin: Secondary | ICD-10-CM | POA: Diagnosis not present

## 2016-02-10 DIAGNOSIS — N200 Calculus of kidney: Secondary | ICD-10-CM | POA: Diagnosis not present

## 2016-02-10 DIAGNOSIS — I10 Essential (primary) hypertension: Secondary | ICD-10-CM | POA: Diagnosis not present

## 2016-02-10 LAB — URINALYSIS COMPLETE WITH MICROSCOPIC (ARMC ONLY)
BILIRUBIN URINE: NEGATIVE
Glucose, UA: NEGATIVE mg/dL
LEUKOCYTES UA: NEGATIVE
NITRITE: NEGATIVE
PH: 5 (ref 5.0–8.0)
Protein, ur: 30 mg/dL — AB
Specific Gravity, Urine: 1.025 (ref 1.005–1.030)
Squamous Epithelial / LPF: NONE SEEN

## 2016-02-10 LAB — COMPREHENSIVE METABOLIC PANEL
ALT: 28 U/L (ref 17–63)
ANION GAP: 9 (ref 5–15)
AST: 28 U/L (ref 15–41)
Albumin: 4.2 g/dL (ref 3.5–5.0)
Alkaline Phosphatase: 37 U/L — ABNORMAL LOW (ref 38–126)
BILIRUBIN TOTAL: 0.6 mg/dL (ref 0.3–1.2)
BUN: 18 mg/dL (ref 6–20)
CO2: 23 mmol/L (ref 22–32)
Calcium: 9.5 mg/dL (ref 8.9–10.3)
Chloride: 105 mmol/L (ref 101–111)
Creatinine, Ser: 1.24 mg/dL (ref 0.61–1.24)
Glucose, Bld: 173 mg/dL — ABNORMAL HIGH (ref 65–99)
POTASSIUM: 4 mmol/L (ref 3.5–5.1)
Sodium: 137 mmol/L (ref 135–145)
TOTAL PROTEIN: 7.7 g/dL (ref 6.5–8.1)

## 2016-02-10 LAB — CBC
HEMATOCRIT: 45.9 % (ref 40.0–52.0)
HEMOGLOBIN: 15.6 g/dL (ref 13.0–18.0)
MCH: 32.2 pg (ref 26.0–34.0)
MCHC: 34 g/dL (ref 32.0–36.0)
MCV: 94.8 fL (ref 80.0–100.0)
Platelets: 218 10*3/uL (ref 150–440)
RBC: 4.84 MIL/uL (ref 4.40–5.90)
RDW: 13.7 % (ref 11.5–14.5)
WBC: 17.3 10*3/uL — ABNORMAL HIGH (ref 3.8–10.6)

## 2016-02-10 MED ORDER — MORPHINE SULFATE (PF) 4 MG/ML IV SOLN
4.0000 mg | Freq: Once | INTRAVENOUS | Status: AC
  Administered 2016-02-10: 4 mg via INTRAVENOUS

## 2016-02-10 MED ORDER — OXYCODONE-ACETAMINOPHEN 5-325 MG PO TABS: 1.0000 | ORAL_TABLET | Freq: Once | ORAL | Status: DC

## 2016-02-10 MED ORDER — MORPHINE SULFATE (PF) 4 MG/ML IV SOLN
INTRAVENOUS | Status: AC
  Filled 2016-02-10: qty 1

## 2016-02-10 MED ORDER — KETOROLAC TROMETHAMINE 30 MG/ML IJ SOLN
INTRAMUSCULAR | Status: AC
  Filled 2016-02-10: qty 1

## 2016-02-10 MED ORDER — SODIUM CHLORIDE 0.9 % IV BOLUS (SEPSIS)
1000.0000 mL | Freq: Once | INTRAVENOUS | Status: AC
Start: 2016-02-10 — End: 2016-02-10
  Administered 2016-02-10: 1000 mL via INTRAVENOUS

## 2016-02-10 MED ORDER — KETOROLAC TROMETHAMINE 30 MG/ML IJ SOLN
30.0000 mg | Freq: Once | INTRAMUSCULAR | Status: AC
  Administered 2016-02-10: 30 mg via INTRAVENOUS

## 2016-02-10 MED ORDER — TAMSULOSIN HCL 0.4 MG PO CAPS: 0.4000 mg | ORAL_CAPSULE | Freq: Every day | ORAL | 0 refills | Status: DC

## 2016-02-10 MED ORDER — ONDANSETRON HCL 4 MG/2ML IJ SOLN
INTRAMUSCULAR | Status: AC
  Filled 2016-02-10: qty 2

## 2016-02-10 MED ORDER — OXYCODONE-ACETAMINOPHEN 5-325 MG PO TABS: 1.0000 | ORAL_TABLET | ORAL | 0 refills | Status: DC | PRN

## 2016-02-10 MED ORDER — MORPHINE SULFATE (PF) 4 MG/ML IV SOLN
4.0000 mg | Freq: Once | INTRAVENOUS | Status: AC
Start: 2016-02-10 — End: 2016-02-10
  Administered 2016-02-10: 4 mg via INTRAVENOUS
  Filled 2016-02-10: qty 1

## 2016-02-10 MED ORDER — ONDANSETRON HCL 4 MG/2ML IJ SOLN
4.0000 mg | Freq: Once | INTRAMUSCULAR | Status: AC
  Administered 2016-02-10: 4 mg via INTRAVENOUS

## 2016-02-10 NOTE — ED Provider Notes (Signed)
[name] Emergency Department Provider Note   First MD Initiated Contact with Patient 02/10/16 [phone]     (approximate)  I have reviewed the triage vital signs and the nursing notes.   HISTORY  Chief Complaint Abdominal Pain and Flank Pain    HPI Edward Bean is a 55 y.o. male presents 10 out of 10 right flank pain radiating to the groin accompanied by vomiting hematuria with onset at midnight. Patient denies any fever no constipation or diarrhea.   Past Medical History:  Diagnosis Date  . Asthma    as child  . Family history of premature CAD 04/02/2013  . Hyperlipidemia LDL goal <130 04/02/2013  . Hypertension, essential   . Impaired glucose tolerance in obese   . Metabolic syndrome    Obese; HLD (TG 243), Low HDL (34), HTN  . Obesity (BMI 30-39.9) 04/02/2013  . OSA (obstructive sleep apnea)    Not currently on CPAP; clinical diagnosis by PCP  . Sleep apnea   . Smoking greater than 10 pack years 04/02/2013  . Syncope     Patient Active Problem List   Diagnosis Date Noted  . Annual physical exam 05/05/2015  . Screening for STD (sexually transmitted disease) 05/05/2015  . Abdominal wall hernia 05/05/2015  . Headache, infrequent episodic tension-type 02/10/2015  . Pre-diabetes 02/10/2015  . Hyperlipidemia LDL goal <130 04/02/2013  . Family history of premature CAD 04/02/2013  . Smoking greater than 10 pack years 04/02/2013  . Obesity (BMI 30-39.9) 04/02/2013  . History of hypertension     Past Surgical History:  Procedure Laterality Date  . KNEE SURGERY     Extensive L Knee surgery  . Treadmill Myoview  January 12 0.15   Exercise 10 min; 12 METS (excellent exercise tolerance)) - read 169 bpm (101% of max predicted) - no ECG or scintigraphic evidence of ischemia or infarction    Prior to Admission medications   Medication Sig Start Date End Date Taking? Authorizing Provider  Aspirin-Salicylamide-Caffeine (BC HEADACHE POWDER PO) Take by  mouth as needed.    Historical Provider, MD  oxyCODONE-acetaminophen (ROXICET) 5-325 MG tablet Take 1 tablet by mouth every 4 (four) hours as needed for severe pain. 02/10/16   [name], MD  tamsulosin [facility]) 0.4 MG CAPS capsule Take 1 capsule (0.4 mg total) by mouth daily after breakfast. 02/10/16   [name], MD    Allergies Shrimp [shellfish allergy]  Family History  Problem Relation Age of Onset  . Esophageal cancer Mother   . Heart attack Mother 39  . Hypertension Mother   . Lung cancer Father     Long term smoker  . Heart attack Father 41    MI -> ~12 yrs later --> CABG  . Hyperlipidemia Father     Social History Social History  Substance Use Topics  . Smoking status: Current Some Day Smoker    Packs/day: 0.50    Years: 10.00    Types: Cigarettes    Last attempt to quit: 03/31/2013  . Smokeless tobacco: Former Systems developer    Quit date: 03/24/2013     Comment: Smoked off & on for ~20 yrs prior to the last ~10 yr spell  . Alcohol use 0.0 oz/week     Comment: ocassional; usually not bothered by 2-3 beers +/- mixed drinks    Review of Systems Constitutional: No fever/chills Eyes: No visual changes. ENT: No sore throat. Cardiovascular: Denies chest pain. Respiratory: Denies shortness of breath. Gastrointestinal:  Positive for abdominal pain and vomiting  Genitourinary: Negative for dysuria.Positive hematuria Musculoskeletal: Negative for back pain. Skin: Negative for rash. Neurological: Negative for headaches, focal weakness or numbness.  10-point ROS otherwise negative.  ____________________________________________   PHYSICAL EXAM:  VITAL SIGNS: ED Triage Vitals  Enc Vitals Group     BP 02/10/16 0536 (!) 126/98     Pulse Rate 02/10/16 0536 (!) 107     Resp 02/10/16 0536 18     Temp 02/10/16 0536 98.1 F (36.7 C)     Temp Source 02/10/16 0536 Oral     SpO2 02/10/16 0536 95 %     Weight 02/10/16 0537 225 lb (102.1 kg)     Height 02/10/16 0537 5'  10" (1.778 m)     Head Circumference --      Peak Flow --      Pain Score 02/10/16 0537 8     Pain Loc --      Pain Edu? --      Excl. in [place]? --     Constitutional: Alert and oriented. Apparent Discomfort. Eyes: Conjunctivae are normal. PERRL. EOMI. Head: Atraumatic. Mouth/Throat: Mucous membranes are moist.  Oropharynx non-erythematous. Neck: No stridor.  No meningeal signs.  No cervical spine tenderness to palpation. Cardiovascular: Normal rate, regular rhythm. Good peripheral circulation. Grossly normal heart sounds. Respiratory: Normal respiratory effort.  No retractions. Lungs CTAB. Gastrointestinal: Soft and nontender. No distention.  Musculoskeletal: No lower extremity tenderness nor edema. No gross deformities of extremities. Neurologic:  Normal speech and language. No gross focal neurologic deficits are appreciated.  Skin:  Skin is warm, dry and intact. No rash noted. Psychiatric: Mood and affect are normal. Speech and behavior are normal.  ____________________________________________   LABS (all labs ordered are listed, but only abnormal results are displayed)  Labs Reviewed  CBC - Abnormal; Notable for the following:       Result Value   WBC 17.3 (*)    All other components within normal limits  COMPREHENSIVE METABOLIC PANEL - Abnormal; Notable for the following:    Glucose, Bld 173 (*)    Alkaline Phosphatase 37 (*)    All other components within normal limits  URINALYSIS COMPLETEWITH MICROSCOPIC ([facility] ONLY)     RADIOLOGY I, [place] [name], personally viewed and evaluated these images (plain radiographs) as part of my medical decision making, as well as reviewing the written report by the radiologist.  Ct Renal Stone Study  Result Date: 02/10/2016 CLINICAL DATA:  Right flank pain radiating to the right lower quadrant, onset last night. Hematuria. EXAM: CT ABDOMEN AND PELVIS WITHOUT CONTRAST TECHNIQUE: Multidetector CT imaging of the abdomen and pelvis was  performed following the standard protocol without IV contrast. COMPARISON:  None. FINDINGS: Lower chest: Noncalcified 6 mm nodule in the left lower lobe, image 8 series 4. Hepatobiliary: No focal liver abnormality is seen. No gallstones, gallbladder wall thickening, or biliary dilatation. Pancreas: Unremarkable. No pancreatic ductal dilatation or surrounding inflammatory changes. Spleen: Normal in size without focal abnormality. Adrenals/Urinary Tract: Both adrenals are normal. There is moderate right hydronephrosis. There are 2 proximal right ureteral calculi. One is at the low L3 level, measuring 3 x 4 mm. The other is 3.8 cm more distal, at the L4 level, measuring 4 mm. There is moderate right hydronephrosis. There are at least 6 additional right collecting system calculi measuring up to 3 x 4 mm. There are at least 7 collecting system calculi on the left measuring up to 5 mm.  Urinary bladder is unremarkable. Stomach/Bowel: Stomach is within normal limits. Appendix appears normal. No evidence of bowel wall thickening, distention, or inflammatory changes. Vascular/Lymphatic: There is mild atherosclerotic calcification of the normal caliber abdominal aorta. No pathologic adenopathy is evident in the abdomen or pelvis. Reproductive: Unremarkable Other: No other acute findings are evident in the abdomen or pelvis. There is no ascites. There is a small fat containing umbilical hernia. Musculoskeletal: No acute or significant osseous findings. IMPRESSION: 1. Moderate right hydronephrosis due to two proximal right ureteral calculi, located at the L3 and L4 levels. 2. Bilateral nephrolithiasis 3. **An incidental finding of potential clinical significance has been found. There is a 6 mm noncalcified pulmonary nodule in the left lower lobe. Non-contrast chest CT at 6-12 months is recommended. If the nodule is stable at time of repeat CT, then future CT at 18-24 months (from today's scan) is considered optional for low-risk  patients, but is recommended for high-risk patients. This recommendation follows the consensus statement: Guidelines for Management of Incidental Pulmonary Nodules Detected on CT Images: From the Fleischner Society 2017; Radiology 2017; 284:228-243.** Electronically Signed   By: Andreas Newport M.D.   On: 02/10/2016 06:35      Procedures      INITIAL IMPRESSION / ASSESSMENT AND PLAN / ED COURSE  Pertinent labs & imaging results that were available during my care of the patient were reviewed by me and considered in my medical decision making (see chart for details).  Patient received IV morphine 4 mg as well as Zofran 4 mg for analgesia and antiemetic however pain persisted as such patient was given an additional 4 mg of IV morphine as well as Toradol 30 mg of improvement of pain. CT scan revealed multiple nephrolithiasis bilaterally as well as to ureterolithiasis on the left. Awaiting urinalysis at this time. If urinalysis does not reveal any evidence of urinary tract infection patient will be discharged home with Percocet and Flomax and Zofran with requisition for follow-up with Dr. Erlene Quan urologist   Clinical Course     ____________________________________________  FINAL CLINICAL IMPRESSION(S) / ED DIAGNOSES  Final diagnoses:  Kidney stone     MEDICATIONS GIVEN DURING THIS VISIT:  Medications  morphine 4 MG/ML injection 4 mg (4 mg Intravenous Given 02/10/16 0559)  ondansetron (ZOFRAN) injection 4 mg (4 mg Intravenous Given 02/10/16 0559)  morphine 4 MG/ML injection 4 mg (4 mg Intravenous Given 02/10/16 0648)  ketorolac (TORADOL) 30 MG/ML injection 30 mg (30 mg Intravenous Given 02/10/16 0647)  sodium chloride 0.9 % bolus 1,000 mL (1,000 mLs Intravenous New Bag/Given 02/10/16 0648)     NEW OUTPATIENT MEDICATIONS STARTED DURING THIS VISIT:  New Prescriptions   OXYCODONE-ACETAMINOPHEN (ROXICET) 5-325 MG TABLET    Take 1 tablet by mouth every 4 (four) hours as needed for  severe pain.   TAMSULOSIN (FLOMAX) 0.4 MG CAPS CAPSULE    Take 1 capsule (0.4 mg total) by mouth daily after breakfast.    Modified Medications   No medications on file    Discontinued Medications   No medications on file     Note:  This document was prepared using Dragon voice recognition software and may include unintentional dictation errors.    [name], MD 02/10/16 [phone]

## 2016-02-10 NOTE — ED Provider Notes (Signed)
Re-evaluated patient. He is comfortable and well-appearing. Urinalysis is reassuring. Pain is controlled. Discussed return precautions at length. Stable for discharge with urology follow up   Lavonia Drafts, MD 02/10/16 [phone]

## 2016-02-10 NOTE — ED Triage Notes (Signed)
Pt ambulatory to triage with steady gait with c/o right flank pain radiating to RLQ since last night. Pt also c/o hematuria, accompanied by nausea and vomiting. Pt denies hx of kidney stones. Pt unable to sit sill or find comfortable position.

## 2016-02-14 ENCOUNTER — Encounter: Payer: Self-pay | Admitting: Urology

## 2016-02-14 ENCOUNTER — Ambulatory Visit: Payer: Managed Care, Other (non HMO) | Admitting: Urology

## 2016-02-14 VITALS — BP 156/84 | HR 83 | Ht 71.0 in | Wt 232.8 lb

## 2016-02-14 DIAGNOSIS — N132 Hydronephrosis with renal and ureteral calculous obstruction: Secondary | ICD-10-CM

## 2016-02-14 DIAGNOSIS — R911 Solitary pulmonary nodule: Secondary | ICD-10-CM | POA: Diagnosis not present

## 2016-02-14 DIAGNOSIS — N201 Calculus of ureter: Secondary | ICD-10-CM | POA: Diagnosis not present

## 2016-02-14 DIAGNOSIS — R310 Gross hematuria: Secondary | ICD-10-CM

## 2016-02-14 DIAGNOSIS — N200 Calculus of kidney: Secondary | ICD-10-CM | POA: Diagnosis not present

## 2016-02-14 LAB — MICROSCOPIC EXAMINATION
Bacteria, UA: NONE SEEN
Epithelial Cells (non renal): NONE SEEN /hpf (ref 0–10)

## 2016-02-14 LAB — URINALYSIS, COMPLETE
Bilirubin, UA: NEGATIVE
GLUCOSE, UA: NEGATIVE
KETONES UA: NEGATIVE
LEUKOCYTES UA: NEGATIVE
Nitrite, UA: NEGATIVE
Protein, UA: NEGATIVE
SPEC GRAV UA: 1.02 (ref 1.005–1.030)
Urobilinogen, Ur: 0.2 mg/dL (ref 0.2–1.0)
pH, UA: 7 (ref 5.0–7.5)

## 2016-02-14 MED ORDER — TAMSULOSIN HCL 0.4 MG PO CAPS: 0.4000 mg | ORAL_CAPSULE | Freq: Every day | ORAL | 0 refills | Status: DC

## 2016-02-14 NOTE — Progress Notes (Signed)
02/14/2016 12:28 PM   Edward Bean [birth year] [id number]  Referring provider: Bobetta Lime, MD [address] [place], [place] [address]  Chief Complaint  Patient presents with  . New Patient (Initial Visit)    kidney stones referral from ER    HPI: Patient is a 55 year old Caucasian male who presents today as referred by [facility] ED for nephrolithiasis.  Patient states the onset of the pain was four days ago.  It was a 10/10 that started in the right flank and radiating to the right groin.  He was having gross hematuria and vomiting.  He did not have fevers or chills.    In the ED, he received morphine, ketorolac and ondansetron.  His UA was positive for 6-30RBC's/hpf.  His WBC count was 17.3.  His serum creatinine was 1.24.  Prior serum creatinine one year ago was 0.85.    CT renal stone study performed on 02/10/2016 noted Moderate right hydronephrosis due to two proximal right ureteral calculi, located at the L3 and L4 levels.  Bilateral nephrolithiasis.  An incidental finding of potential clinical significance has been found. There is a 6 mm noncalcified pulmonary nodule in the left lower lobe. Non-contrast chest CT at 6-12 months is recommended. If the nodule is stable at time of repeat CT, then future CT at 18-24 months (from today's scan) is considered optional for low-risk patients, but is recommended for high-risk patients.  This recommendation follows the consensus statement: Guidelines for Management of Incidental Pulmonary Nodules Detected on CT Images: From the Fleischner Society 2017; Radiology 2017; 284:228-243.  I have independently reviewed the films.  Today, he is experiencing and achiness in his bladder.  He has a dull intermittent right lower quadrant pain.  He states his urine is clear yellow.  He has passed tiny fragments and has brought that with him today.  He has not had any further gross hematuria, flank pain, nausea or vomiting.  He denies any  fevers or chills.  His UA today demonstrates 3-10 rbc's per high-power field.  He  does not have a prior history of stones.    PMH: Past Medical History:  Diagnosis Date  . Asthma    as child  . Family history of premature CAD 04/02/2013  . Hyperlipidemia LDL goal <130 04/02/2013  . Hypertension, essential   . Impaired glucose tolerance in obese   . Kidney stone   . Metabolic syndrome    Obese; HLD (TG 243), Low HDL (34), HTN  . Obesity (BMI 30-39.9) 04/02/2013  . OSA (obstructive sleep apnea)    Not currently on CPAP; clinical diagnosis by PCP  . Sleep apnea   . Smoking greater than 10 pack years 04/02/2013  . Syncope     Surgical History: Past Surgical History:  Procedure Laterality Date  . KNEE SURGERY     Extensive L Knee surgery  . Treadmill Myoview  January 12 0.15   Exercise 10 min; 12 METS (excellent exercise tolerance)) - read 169 bpm (101% of max predicted) - no ECG or scintigraphic evidence of ischemia or infarction    Home Medications:    Medication List       Accurate as of 02/14/16 12:28 PM. Always use your most recent med list.          BC HEADACHE POWDER PO Take by mouth as needed.   ibuprofen 200 MG tablet Commonly known as:  ADVIL,MOTRIN Take 200 mg by mouth every 6 (six) hours as  needed.   oxyCODONE-acetaminophen 5-325 MG tablet Commonly known as:  ROXICET Take 1 tablet by mouth every 4 (four) hours as needed for severe pain.   tamsulosin 0.4 MG Caps capsule Commonly known as:  FLOMAX Take 1 capsule (0.4 mg total) by mouth daily after breakfast.       Allergies:  Allergies  Allergen Reactions  . Shrimp [Shellfish Allergy]     Itching and rash    Family History: Family History  Problem Relation Age of Onset  . Esophageal cancer Mother   . Heart attack Mother 32  . Hypertension Mother   . Bladder Cancer Mother   . Lung cancer Father     Long term smoker  . Heart attack Father 62    MI -> ~12 yrs later --> CABG  . Hyperlipidemia  Father   . Kidney disease Neg Hx   . Prostate cancer Neg Hx     Social History:  reports that he has been smoking Cigarettes.  He has a 5.00 pack-year smoking history. He quit smokeless tobacco use about 2 years ago. He reports that he drinks alcohol. He reports that he does not use drugs.  ROS: UROLOGY Frequent Urination?: No Hard to postpone urination?: No Burning/pain with urination?: No Get up at night to urinate?: No Leakage of urine?: No Urine stream starts and stops?: No Trouble starting stream?: No Do you have to strain to urinate?: No Blood in urine?: Yes Urinary tract infection?: No Sexually transmitted disease?: No Injury to kidneys or bladder?: No Painful intercourse?: No Weak stream?: No Erection problems?: No Penile pain?: No  Gastrointestinal Nausea?: No Vomiting?: No Indigestion/heartburn?: No Diarrhea?: No Constipation?: No  Constitutional Fever: No Night sweats?: No Weight loss?: No Fatigue?: No  Skin Skin rash/lesions?: No Itching?: No  Eyes Blurred vision?: No Double vision?: No  Ears/Nose/Throat Sore throat?: No Sinus problems?: Yes  Hematologic/Lymphatic Swollen glands?: No Easy bruising?: No  Cardiovascular Leg swelling?: No Chest pain?: No  Respiratory Cough?: Yes Shortness of breath?: No  Endocrine Excessive thirst?: No  Musculoskeletal Back pain?: No Joint pain?: No  Neurological Headaches?: No Dizziness?: No  Psychologic Depression?: No Anxiety?: No  Physical Exam: BP (!) 156/84   Pulse 83   Ht _0  (1.803 m)   Wt 232 lb 12.8 oz (105.6 kg)   BMI 32.47 kg/m   Constitutional: Well nourished. Alert and oriented, No acute distress. HEENT: [place] AT, moist mucus membranes. Trachea midline, no masses. Cardiovascular: No clubbing, cyanosis, or edema. Respiratory: Normal respiratory effort, no increased work of breathing. GI: Abdomen is soft, non tender, non distended, no abdominal masses. Liver and spleen not  palpable.  No hernias appreciated.  Stool sample for occult testing is not indicated.   GU: No CVA tenderness.  No bladder fullness or masses.   Skin: No rashes, bruises or suspicious lesions. Lymph: No cervical or inguinal adenopathy. Neurologic: Grossly intact, no focal deficits, moving all 4 extremities. Psychiatric: Normal mood and affect.  Laboratory Data: Lab Results  Component Value Date   WBC 17.3 (H) 02/10/2016   HGB 15.6 02/10/2016   HCT 45.9 02/10/2016   MCV 94.8 02/10/2016   PLT 218 02/10/2016    Lab Results  Component Value Date   CREATININE 1.24 02/10/2016    Lab Results  Component Value Date   HGBA1C 6.0 (H) 02/12/2015    Lab Results  Component Value Date   TSH 1.330 02/12/2015       Component Value Date/Time   CHOL  181 05/05/2015 1136   HDL 35 (L) 05/05/2015 1136   CHOLHDL 5.2 (H) 05/05/2015 1136   LDLCALC 123 (H) 05/05/2015 1136    Lab Results  Component Value Date   AST 28 02/10/2016   Lab Results  Component Value Date   ALT 28 02/10/2016    Urinalysis 3-10 RBC's/hpf.  See EPIC.   Pertinent Imaging: CLINICAL DATA:  Right flank pain radiating to the right lower quadrant, onset last night. Hematuria.  EXAM: CT ABDOMEN AND PELVIS WITHOUT CONTRAST  TECHNIQUE: Multidetector CT imaging of the abdomen and pelvis was performed following the standard protocol without IV contrast.  COMPARISON:  None.  FINDINGS: Lower chest: Noncalcified 6 mm nodule in the left lower lobe, image 8 series 4.  Hepatobiliary: No focal liver abnormality is seen. No gallstones, gallbladder wall thickening, or biliary dilatation.  Pancreas: Unremarkable. No pancreatic ductal dilatation or surrounding inflammatory changes.  Spleen: Normal in size without focal abnormality.  Adrenals/Urinary Tract: Both adrenals are normal. There is moderate right hydronephrosis. There are 2 proximal right ureteral calculi. One is at the low L3 level, measuring 3 x  4 mm. The other is 3.8 cm more distal, at the L4 level, measuring 4 mm. There is moderate right hydronephrosis. There are at least 6 additional right collecting system calculi measuring up to 3 x 4 mm. There are at least 7 collecting system calculi on the left measuring up to 5 mm.  Urinary bladder is unremarkable.  Stomach/Bowel: Stomach is within normal limits. Appendix appears normal. No evidence of bowel wall thickening, distention, or inflammatory changes.  Vascular/Lymphatic: There is mild atherosclerotic calcification of the normal caliber abdominal aorta. No pathologic adenopathy is evident in the abdomen or pelvis.  Reproductive: Unremarkable  Other: No other acute findings are evident in the abdomen or pelvis. There is no ascites. There is a small fat containing umbilical hernia.  Musculoskeletal: No acute or significant osseous findings.  IMPRESSION: 1. Moderate right hydronephrosis due to two proximal right ureteral calculi, located at the L3 and L4 levels. 2. Bilateral nephrolithiasis 3. **An incidental finding of potential clinical significance has been found. There is a 6 mm noncalcified pulmonary nodule in the left lower lobe. Non-contrast chest CT at 6-12 months is recommended. If the nodule is stable at time of repeat CT, then future CT at 18-24 months (from today's scan) is considered optional for low-risk patients, but is recommended for high-risk patients. This recommendation follows the consensus statement: Guidelines for Management of Incidental Pulmonary Nodules Detected on CT Images: From the Fleischner Society 2017; Radiology 2017; 284:228-243.**   Electronically Signed   By: Andreas Newport M.D.   On: 02/10/2016 06:35   Assessment & Plan:    1. Right ureteral stones  - I discussed with the patient MET vs ESWL vs URS/LL/ureteral stent placement as definitive treatment options for his two right ureteral stones  - I encourage the  patient to pursue MET therapy as the mean passage rate for stones 4 mm to 6 mm in size is 15 to 22 days and from the description of his current symptoms- the stones are most likely near the bladder  - I have refilled his tamsulosin 0.4 mg daily  - I have encouraged the patient to increase his fluid intake to 10 to 12 cups of fluid daily  - Urinalysis, Complete  - CULTURE, URINE COMPREHENSIVE  - I have asked him to continue to strain his urine for stone fragments  - RTC in 2 weeks  for KUB and UA  - Advised to contact our office or seek treatment in the ED if becomes febrile or pain/ vomiting are difficult control in order to arrange for emergent/urgent intervention  2. Right hydronephrosis  - RUS will be obtained one month after he has passed his stones to ensure his hydronephrosis has resolved  3. Gross hematuria  - We will continue to monitor the patient's UA after the treatment/passage of the stone to ensure the hematuria has resolved.  If hematuria persists, we will pursue a hematuria workup with CT Urogram and cystoscopy if appropriate.  4. Bilateral nephrolithiasis  - will encourage the patient to undergo 24 hour once right ureteral stones have passed  5. Pulmonary nodule  - remind patient to follow with PCP regarding this finding on CT  Return in about 2 weeks (around 02/28/2016) for KUB, UA and symptom recheck.  These notes generated with voice recognition software. I apologize for typographical errors.  Zara Council, [facility] [address], [place], [place] [address] [phone]

## 2016-02-16 LAB — CULTURE, URINE COMPREHENSIVE

## 2016-02-28 ENCOUNTER — Ambulatory Visit
Admission: RE | Admit: 2016-02-28 | Discharge: 2016-02-28 | Disposition: A | Discharge status: Home / Self Care | Payer: Managed Care, Other (non HMO) | Source: Ambulatory Visit | Attending: Urology | Admitting: Urology

## 2016-02-28 DIAGNOSIS — N201 Calculus of ureter: Secondary | ICD-10-CM | POA: Insufficient documentation

## 2016-02-28 DIAGNOSIS — I878 Other specified disorders of veins: Secondary | ICD-10-CM | POA: Diagnosis not present

## 2016-02-29 ENCOUNTER — Encounter: Payer: Self-pay | Admitting: Urology

## 2016-02-29 ENCOUNTER — Ambulatory Visit (INDEPENDENT_AMBULATORY_CARE_PROVIDER_SITE_OTHER): Payer: Managed Care, Other (non HMO) | Admitting: Urology

## 2016-02-29 VITALS — BP 114/81 | HR 84 | Ht 71.0 in | Wt 231.1 lb

## 2016-02-29 DIAGNOSIS — N200 Calculus of kidney: Secondary | ICD-10-CM | POA: Diagnosis not present

## 2016-02-29 DIAGNOSIS — N201 Calculus of ureter: Secondary | ICD-10-CM | POA: Diagnosis not present

## 2016-02-29 DIAGNOSIS — R310 Gross hematuria: Secondary | ICD-10-CM

## 2016-02-29 DIAGNOSIS — R911 Solitary pulmonary nodule: Secondary | ICD-10-CM

## 2016-02-29 DIAGNOSIS — N132 Hydronephrosis with renal and ureteral calculous obstruction: Secondary | ICD-10-CM | POA: Diagnosis not present

## 2016-02-29 LAB — URINALYSIS, COMPLETE
Bilirubin, UA: NEGATIVE
GLUCOSE, UA: NEGATIVE
Ketones, UA: NEGATIVE
Leukocytes, UA: NEGATIVE
Nitrite, UA: NEGATIVE
PROTEIN UA: NEGATIVE
RBC, UA: NEGATIVE
Specific Gravity, UA: <1.005 — ABNORMAL LOW (ref 1.005–1.030)
Urobilinogen, Ur: 0.2 mg/dL (ref 0.2–1.0)
pH, UA: 6.5 (ref 5.0–7.5)

## 2016-02-29 LAB — MICROSCOPIC EXAMINATION
Bacteria, UA: NONE SEEN
RBC, UA: NONE SEEN /hpf (ref 0–?)

## 2016-02-29 NOTE — Progress Notes (Signed)
02/29/2016 9:05 AM   Edward Bean 30-Mar-1960 [id number]  Referring provider: Bobetta Lime, MD [address] [place], [place] [address]  Chief Complaint  Patient presents with  . Nephrolithiasis    HPI: Patient is a 55 year old Caucasian male who presents today for a 2 week follow-up for right ureteral stones with a KUB and urinalysis.   Background history  Patient was referred by [facility] ED for nephrolithiasis.  Patient states the onset of the pain was four days ago.  It was a 10/10 that started in the right flank and radiating to the right groin.  He was having gross hematuria and vomiting.  He did not have fevers or chills.   In the ED, he received morphine, ketorolac and ondansetron.  His UA was positive for 6-30RBC's/hpf.  His WBC count was 17.3.  His serum creatinine was 1.24.  Prior serum creatinine one year ago was 0.85.  CT renal stone study performed on 02/10/2016 noted Moderate right hydronephrosis due to two proximal right ureteral calculi, located at the L3 and L4 levels.  Bilateral nephrolithiasis.  An incidental finding of potential clinical significance has been found. There is a 6 mm noncalcified pulmonary nodule in the left lower lobe. Non-contrast chest CT at 6-12 months is recommended. If the nodule is stable at time of repeat CT, then future CT at 18-24 months (from today's scan) is considered optional for low-risk patients, but is recommended for high-risk patients.  This recommendation follows the consensus statement: Guidelines for Management of Incidental Pulmonary Nodules Detected on CT Images: From the Fleischner Society 2017; Radiology 2017; 284:228-243.  I have independently reviewed the films.  Today, he is experiencing and achiness in his bladder.  He has a dull intermittent right lower quadrant pain.  He states his urine is clear yellow.  He has passed tiny fragments and has brought that with him today.  He has not had any further gross hematuria,  flank pain, nausea or vomiting.  He denies any fevers or chills.  His UA demonstrates 3-10 rbc's per high-power field.  He  does not have a prior history of stones.    KUB taken on 02/28/2016 noted a phlebolith noted within the left pelvis.  I have independently reviewed the films.    Today, he is still having an uncomfortableness in his right and lower quadrants.  He states his urine is dark in color.  He is color blind, so he cannot tell if there is blood in his urine.  He has not had any intense flank pain.  He has not had fevers, chills, nausea or vomiting.  His UA today is unremarkable.    PMH: Past Medical History:  Diagnosis Date  . Asthma    as child  . Family history of premature CAD 04/02/2013  . Hyperlipidemia LDL goal <130 04/02/2013  . Hypertension, essential   . Impaired glucose tolerance in obese   . Kidney stone   . Metabolic syndrome    Obese; HLD (TG 243), Low HDL (34), HTN  . Obesity (BMI 30-39.9) 04/02/2013  . OSA (obstructive sleep apnea)    Not currently on CPAP; clinical diagnosis by PCP  . Sleep apnea   . Smoking greater than 10 pack years 04/02/2013  . Syncope     Surgical History: Past Surgical History:  Procedure Laterality Date  . KNEE SURGERY     Extensive L Knee surgery  . Treadmill Myoview  January 12 0.15   Exercise  10 min; 12 METS (excellent exercise tolerance)) - read 169 bpm (101% of max predicted) - no ECG or scintigraphic evidence of ischemia or infarction    Home Medications:    Medication List       Accurate as of 02/29/16  9:05 AM. Always use your most recent med list.          BC HEADACHE POWDER PO Take by mouth as needed.   ibuprofen 200 MG tablet Commonly known as:  ADVIL,MOTRIN Take 200 mg by mouth every 6 (six) hours as needed.   oxyCODONE-acetaminophen 5-325 MG tablet Commonly known as:  ROXICET Take 1 tablet by mouth every 4 (four) hours as needed for severe pain.   tamsulosin 0.4 MG Caps capsule Commonly known as:   FLOMAX Take 1 capsule (0.4 mg total) by mouth daily after breakfast.       Allergies:  Allergies  Allergen Reactions  . Shrimp [Shellfish Allergy]     Itching and rash    Family History: Family History  Problem Relation Age of Onset  . Esophageal cancer Mother   . Heart attack Mother 27  . Hypertension Mother   . Bladder Cancer Mother   . Lung cancer Father     Long term smoker  . Heart attack Father 14    MI -> ~12 yrs later --> CABG  . Hyperlipidemia Father   . Kidney disease Neg Hx   . Prostate cancer Neg Hx     Social History:  reports that he has been smoking Cigarettes.  He has a 5.00 pack-year smoking history. He quit smokeless tobacco use about 2 years ago. He reports that he drinks alcohol. He reports that he does not use drugs.  ROS: UROLOGY Frequent Urination?: No Hard to postpone urination?: No Burning/pain with urination?: No Get up at night to urinate?: No Leakage of urine?: No Urine stream starts and stops?: No Trouble starting stream?: No Do you have to strain to urinate?: No Blood in urine?: No Urinary tract infection?: No Sexually transmitted disease?: No Injury to kidneys or bladder?: No Painful intercourse?: No Weak stream?: No Erection problems?: No Penile pain?: No  Gastrointestinal Nausea?: No Vomiting?: No Indigestion/heartburn?: No Diarrhea?: No Constipation?: No  Constitutional Fever: No Night sweats?: No Weight loss?: No Fatigue?: Yes  Skin Skin rash/lesions?: No Itching?: No  Eyes Blurred vision?: No Double vision?: No  Ears/Nose/Throat Sore throat?: No Sinus problems?: Yes  Hematologic/Lymphatic Swollen glands?: No Easy bruising?: No  Cardiovascular Leg swelling?: No Chest pain?: No  Respiratory Cough?: No Shortness of breath?: No  Endocrine Excessive thirst?: No  Musculoskeletal Back pain?: No Joint pain?: No  Neurological Headaches?: Yes Dizziness?: No  Psychologic Depression?:  No Anxiety?: No  Physical Exam: BP 114/81 (BP Location: Left Arm, Patient Position: Sitting, Cuff Size: Large)   Pulse 84   Ht 5\' 11"  (1.803 m)   Wt 231 lb 1.6 oz (104.8 kg)   BMI 32.23 kg/m   Constitutional: Well nourished. Alert and oriented, No acute distress. HEENT: [place] AT, moist mucus membranes. Trachea midline, no masses. Cardiovascular: No clubbing, cyanosis, or edema. Respiratory: Normal respiratory effort, no increased work of breathing. GI: Abdomen is soft, non tender, non distended, no abdominal masses. Liver and spleen not palpable.  No hernias appreciated.  Stool sample for occult testing is not indicated.   GU: No CVA tenderness.  No bladder fullness or masses.   Skin: No rashes, bruises or suspicious lesions. Lymph: No cervical or inguinal adenopathy. Neurologic: Grossly  intact, no focal deficits, moving all 4 extremities. Psychiatric: Normal mood and affect.  Laboratory Data: Lab Results  Component Value Date   WBC 17.3 (H) 02/10/2016   HGB 15.6 02/10/2016   HCT 45.9 02/10/2016   MCV 94.8 02/10/2016   PLT 218 02/10/2016    Lab Results  Component Value Date   CREATININE 1.24 02/10/2016    Lab Results  Component Value Date   HGBA1C 6.0 (H) 02/12/2015    Lab Results  Component Value Date   TSH 1.330 02/12/2015       Component Value Date/Time   CHOL 181 05/05/2015 1136   HDL 35 (L) 05/05/2015 1136   CHOLHDL 5.2 (H) 05/05/2015 1136   LDLCALC 123 (H) 05/05/2015 1136    Lab Results  Component Value Date   AST 28 02/10/2016   Lab Results  Component Value Date   ALT 28 02/10/2016    Urinalysis Unremarkable.  See EPIC.   Pertinent Imaging: CLINICAL DATA:  Bilateral renal calculi  EXAM: ABDOMEN - 1 VIEW  COMPARISON:  02/10/2016  FINDINGS: Scattered large and small bowel gas is noted. Some linear calcifications are noted over the midportion of the right kidney corresponding to the nonobstructing stones seen previously. No ureteral  calculi are seen. Similar changes are noted over the left kidney also stable from the prior exam. A phlebolith is noted in the left pelvis. No bony abnormality is seen.  IMPRESSION: Phlebolith noted within the left pelvis.   Electronically Signed   By: Inez Catalina M.D.   On: 02/28/2016 10:42  Assessment & Plan:    1. Right ureteral stones  - not seen on today's x-ray  - still having symptoms, RTC in 2 weeks with KUB  - Advised to contact our office or seek treatment in the ED if becomes febrile or pain/ vomiting are difficult control in order to arrange for emergent/urgent intervention  2. Right hydronephrosis  - RUS will be obtained one month after he has passed his stones to ensure his hydronephrosis has resolved  3. Gross hematuria  - UA is clear today  4. Bilateral nephrolithiasis  - will encourage the patient to undergo 24 hour once right ureteral stones have passed  5. Pulmonary nodule  - remind patient to follow with PCP regarding this finding on CT  Return in about 2 weeks (around 03/14/2016) for KUB and office visit.  These notes generated with voice recognition software. I apologize for typographical errors.  Zara Council, [facility] [address] [place], [place] [address] [phone]

## 2016-03-13 ENCOUNTER — Ambulatory Visit
Admission: RE | Admit: 2016-03-13 | Discharge: 2016-03-13 | Disposition: A | Discharge status: Home / Self Care | Payer: Managed Care, Other (non HMO) | Source: Ambulatory Visit | Attending: Urology | Admitting: Urology

## 2016-03-13 DIAGNOSIS — N200 Calculus of kidney: Secondary | ICD-10-CM

## 2016-03-13 NOTE — Progress Notes (Signed)
03/14/2016 9:37 AM   Edward Bean [birth year] [id number]  Referring provider: Bobetta Lime, MD [address] [place], [place] [address]  Chief Complaint  Patient presents with  . Nephrolithiasis    KUB results    HPI: Patient is a 56 year old Caucasian male who presents today for a 2 week follow-up for right ureteral stones with a KUB and urinalysis.   Background history  Patient was referred by [facility] ED for nephrolithiasis.  Patient states the onset of the pain was four days ago.  It was a 10/10 that started in the right flank and radiating to the right groin.  He was having gross hematuria and vomiting.  He did not have fevers or chills.   In the ED, he received morphine, ketorolac and ondansetron.  His UA was positive for 6-30RBC's/hpf.  His WBC count was 17.3.  His serum creatinine was 1.24.  Prior serum creatinine one year ago was 0.85.  CT renal stone study performed on 02/10/2016 noted Moderate right hydronephrosis due to two proximal right ureteral calculi, located at the L3 and L4 levels.  Bilateral nephrolithiasis.  An incidental finding of potential clinical significance has been found. There is a 6 mm noncalcified pulmonary nodule in the left lower lobe. Non-contrast chest CT at 6-12 months is recommended. If the nodule is stable at time of repeat CT, then future CT at 18-24 months (from today's scan) is considered optional for low-risk patients, but is recommended for high-risk patients.  This recommendation follows the consensus statement: Guidelines for Management of Incidental Pulmonary Nodules Detected on CT Images: From the Fleischner Society 2017; Radiology 2017; 284:228-243.  I have independently reviewed the films.  Today, he is experiencing and achiness in his bladder.  He has a dull intermittent right lower quadrant pain.  He states his urine is clear yellow.  He has passed tiny fragments and has brought that with him today.  He has not had any further  gross hematuria, flank pain, nausea or vomiting.  He denies any fevers or chills.  His UA demonstrates 3-10 rbc's per high-power field.  He  does not have a prior history of stones.    KUB taken on 03/13/2016 noted stable bilateral nonobstructing renal stones.  Calcification of the right half of the sacrum which may represent the previously seen right ureteral calculus. I have independently reviewed the films.    Today, he is not experiencing any flank pain.  He has not had any gross hematuria.  His urine is unremarkable.  He has not had fevers, chills, nausea or vomiting.  He has not passed any fragments.  He discontinued the tamsulosin due to the side effect of headaches.  He has stopped smoking.    PMH: Past Medical History:  Diagnosis Date  . Asthma    as child  . Family history of premature CAD 04/02/2013  . Hyperlipidemia LDL goal <130 04/02/2013  . Hypertension, essential   . Impaired glucose tolerance in obese   . Kidney stone   . Metabolic syndrome    Obese; HLD (TG 243), Low HDL (34), HTN  . Obesity (BMI 30-39.9) 04/02/2013  . OSA (obstructive sleep apnea)    Not currently on CPAP; clinical diagnosis by PCP  . Sleep apnea   . Smoking greater than 10 pack years 04/02/2013  . Syncope     Surgical History: Past Surgical History:  Procedure Laterality Date  . KNEE SURGERY     Extensive L Knee  surgery  . Treadmill Myoview  January 12 0.15   Exercise 10 min; 12 METS (excellent exercise tolerance)) - read 169 bpm (101% of max predicted) - no ECG or scintigraphic evidence of ischemia or infarction    Home Medications:  Allergies as of 03/14/2016      Reactions   Shrimp [shellfish Allergy]    Itching and rash      Medication List       Accurate as of 03/14/16  9:37 AM. Always use your most recent med list.          BC HEADACHE POWDER PO Take by mouth as needed.   ibuprofen 200 MG tablet Commonly known as:  ADVIL,MOTRIN Take 200 mg by mouth every 6 (six) hours as  needed.   oxyCODONE-acetaminophen 5-325 MG tablet Commonly known as:  ROXICET Take 1 tablet by mouth every 4 (four) hours as needed for severe pain.   tamsulosin 0.4 MG Caps capsule Commonly known as:  FLOMAX Take 1 capsule (0.4 mg total) by mouth daily after breakfast.       Allergies:  Allergies  Allergen Reactions  . Shrimp [Shellfish Allergy]     Itching and rash    Family History: Family History  Problem Relation Age of Onset  . Esophageal cancer Mother   . Heart attack Mother 54  . Hypertension Mother   . Bladder Cancer Mother   . Lung cancer Father     Long term smoker  . Heart attack Father 19    MI -> ~12 yrs later --> CABG  . Hyperlipidemia Father   . Kidney disease Neg Hx   . Prostate cancer Neg Hx     Social History:  reports that he has been smoking Cigarettes.  He has a 5.00 pack-year smoking history. He quit smokeless tobacco use about 2 years ago. He reports that he drinks alcohol. He reports that he does not use drugs.  ROS: UROLOGY Frequent Urination?: No Hard to postpone urination?: No Burning/pain with urination?: No Get up at night to urinate?: No Leakage of urine?: No Urine stream starts and stops?: No Trouble starting stream?: No Do you have to strain to urinate?: No Blood in urine?: No Urinary tract infection?: No Sexually transmitted disease?: No Injury to kidneys or bladder?: No Painful intercourse?: No Weak stream?: No Erection problems?: No Penile pain?: No  Gastrointestinal Nausea?: No Vomiting?: No Indigestion/heartburn?: No Diarrhea?: No Constipation?: No  Constitutional Fever: No Night sweats?: No Weight loss?: No Fatigue?: No  Skin Skin rash/lesions?: No Itching?: No  Eyes Blurred vision?: No Double vision?: No  Ears/Nose/Throat Sore throat?: No Sinus problems?: No  Hematologic/Lymphatic Swollen glands?: No Easy bruising?: No  Cardiovascular Leg swelling?: No Chest pain?:  No  Respiratory Cough?: No Shortness of breath?: No  Endocrine Excessive thirst?: No  Musculoskeletal Back pain?: No Joint pain?: No  Neurological Headaches?: No Dizziness?: No  Psychologic Depression?: No Anxiety?: No  Physical Exam: BP 133/80 (BP Location: Left Arm, Patient Position: Sitting, Cuff Size: Large)   Pulse 63   Ht 5\' 11"  (1.803 m)   Wt 233 lb 9.6 oz (106 kg)   BMI 32.58 kg/m   Constitutional: Well nourished. Alert and oriented, No acute distress. HEENT: [place] AT, moist mucus membranes. Trachea midline, no masses. Cardiovascular: No clubbing, cyanosis, or edema. Respiratory: Normal respiratory effort, no increased work of breathing. GI: Abdomen is soft, non tender, non distended, no abdominal masses. Liver and spleen not palpable.  No hernias appreciated.  Stool  sample for occult testing is not indicated.   GU: No CVA tenderness.  No bladder fullness or masses.   Skin: No rashes, bruises or suspicious lesions. Lymph: No cervical or inguinal adenopathy. Neurologic: Grossly intact, no focal deficits, moving all 4 extremities. Psychiatric: Normal mood and affect.  Laboratory Data: Lab Results  Component Value Date   WBC 17.3 (H) 02/10/2016   HGB 15.6 02/10/2016   HCT 45.9 02/10/2016   MCV 94.8 02/10/2016   PLT 218 02/10/2016    Lab Results  Component Value Date   CREATININE 1.24 02/10/2016    Lab Results  Component Value Date   HGBA1C 6.0 (H) 02/12/2015    Lab Results  Component Value Date   TSH 1.330 02/12/2015       Component Value Date/Time   CHOL 181 05/05/2015 1136   HDL 35 (L) 05/05/2015 1136   CHOLHDL 5.2 (H) 05/05/2015 1136   LDLCALC 123 (H) 05/05/2015 1136    Lab Results  Component Value Date   AST 28 02/10/2016   Lab Results  Component Value Date   ALT 28 02/10/2016    Urinalysis Unremarkable.  See EPIC.   Pertinent Imaging: CLINICAL DATA:  Bilateral renal calculi  EXAM: ABDOMEN - 1 VIEW  COMPARISON:   02/28/2016, 02/10/2016  FINDINGS: Scattered bowel gas is noted without obstructive changes. No free air is seen. There are tiny fleck like calcifications identified within the kidneys bilaterally stable from prior exam. The previously seen right ureteral calculi is not as well appreciated proximally. There is a small 7 mm calcification overlying right sacrum. This is better visualized than on the prior exam and may represent the previously seen proximal ureteral stone.  IMPRESSION: Stable bilateral nonobstructing renal stones.  Calcification of the right half of the sacrum which may represent the previously seen right ureteral calculus.   Electronically Signed   By: Inez Catalina M.D.   On: 03/13/2016 08:26  Assessment & Plan:    1. Right ureteral stones  - one stone seen on today's x-ray  - obtain a RUS to assess the current status of his right kidney - I will call with results  - Advised to contact our office or seek treatment in the ED if becomes febrile or pain/ vomiting are difficult control in order to arrange for emergent/urgent intervention  2. Right hydronephrosis  - RUS will be obtained one month after he has passed his stones to ensure his hydronephrosis has resolved  3. Gross hematuria  - UA is clear today  4. Bilateral nephrolithiasis  - will encourage the patient to undergo 24 hour once right ureteral stones have passed  5. Pulmonary nodule  - remind patient to follow with PCP regarding this finding on CT  Return for I will call patient with results.  These notes generated with voice recognition software. I apologize for typographical errors.  Zara Council, [facility] [address], [place], [place] [address] [phone]

## 2016-03-14 ENCOUNTER — Encounter: Payer: Self-pay | Admitting: Urology

## 2016-03-14 ENCOUNTER — Ambulatory Visit: Payer: Managed Care, Other (non HMO) | Admitting: Urology

## 2016-03-14 VITALS — BP 133/80 | HR 63 | Ht 71.0 in | Wt 233.6 lb

## 2016-03-14 DIAGNOSIS — N132 Hydronephrosis with renal and ureteral calculous obstruction: Secondary | ICD-10-CM

## 2016-03-14 DIAGNOSIS — R310 Gross hematuria: Secondary | ICD-10-CM

## 2016-03-14 DIAGNOSIS — R911 Solitary pulmonary nodule: Secondary | ICD-10-CM | POA: Diagnosis not present

## 2016-03-14 DIAGNOSIS — N200 Calculus of kidney: Secondary | ICD-10-CM

## 2016-03-14 DIAGNOSIS — N201 Calculus of ureter: Secondary | ICD-10-CM | POA: Diagnosis not present

## 2016-03-14 LAB — MICROSCOPIC EXAMINATION
BACTERIA UA: NONE SEEN
Epithelial Cells (non renal): NONE SEEN /hpf (ref 0–10)

## 2016-03-14 LAB — URINALYSIS, COMPLETE
BILIRUBIN UA: NEGATIVE
GLUCOSE, UA: NEGATIVE
KETONES UA: NEGATIVE
LEUKOCYTES UA: NEGATIVE
Nitrite, UA: NEGATIVE
PH UA: 7 (ref 5.0–7.5)
PROTEIN UA: NEGATIVE
RBC UA: NEGATIVE
SPEC GRAV UA: 1.015 (ref 1.005–1.030)
UUROB: 0.2 mg/dL (ref 0.2–1.0)

## 2016-03-29 ENCOUNTER — Ambulatory Visit: Payer: Managed Care, Other (non HMO)

## 2016-03-31 ENCOUNTER — Ambulatory Visit
Admission: RE | Admit: 2016-03-31 | Discharge: 2016-03-31 | Disposition: A | Discharge status: Home / Self Care | Payer: Managed Care, Other (non HMO) | Source: Ambulatory Visit | Attending: Urology | Admitting: Urology

## 2016-03-31 ENCOUNTER — Telehealth: Payer: Self-pay

## 2016-03-31 DIAGNOSIS — R748 Abnormal levels of other serum enzymes: Secondary | ICD-10-CM | POA: Insufficient documentation

## 2016-03-31 DIAGNOSIS — N132 Hydronephrosis with renal and ureteral calculous obstruction: Secondary | ICD-10-CM | POA: Insufficient documentation

## 2016-03-31 NOTE — Telephone Encounter (Signed)
-----   Message from Nori Riis, PA-C sent at 03/31/2016 12:26 PM EST ----- Please notify the patient that his kidneys are not swollen, so he must of passed those stones.  I would advise him to undergo a 24 hour to evaluate why he forms stones.

## 2016-03-31 NOTE — Telephone Encounter (Signed)
Spoke with pt in reference to RUS results and litho link. Pt agreed to [employer]. Will fax out orders.

## 2016-04-06 ENCOUNTER — Ambulatory Visit (INDEPENDENT_AMBULATORY_CARE_PROVIDER_SITE_OTHER): Payer: Managed Care, Other (non HMO)

## 2016-04-06 VITALS — BP 151/90 | HR 86 | Ht 71.0 in | Wt 237.7 lb

## 2016-04-06 DIAGNOSIS — R310 Gross hematuria: Secondary | ICD-10-CM

## 2016-04-06 LAB — URINALYSIS, COMPLETE
BILIRUBIN UA: NEGATIVE
GLUCOSE, UA: NEGATIVE
Ketones, UA: NEGATIVE
LEUKOCYTES UA: NEGATIVE
Nitrite, UA: NEGATIVE
Specific Gravity, UA: 1.02 (ref 1.005–1.030)
UUROB: 0.2 mg/dL (ref 0.2–1.0)
pH, UA: 7 (ref 5.0–7.5)

## 2016-04-06 LAB — MICROSCOPIC EXAMINATION
BACTERIA UA: NONE SEEN
Epithelial Cells (non renal): NONE SEEN /hpf (ref 0–10)
RBC, UA: >30 /hpf — AB (ref 0–?)
WBC UA: NONE SEEN /HPF (ref 0–?)

## 2016-04-06 NOTE — Progress Notes (Signed)
Pt presented today with c/o gross hematuria, back/flank pain, and lower abd pain. A clean catch urine was obtained for a u/a and cx.   Blood pressure (!) 151/90, pulse 86, height 5\' 11"  (1.803 m), weight 237 lb 11.2 oz (107.8 kg).

## 2016-04-07 ENCOUNTER — Telehealth: Payer: Self-pay

## 2016-04-07 DIAGNOSIS — N200 Calculus of kidney: Secondary | ICD-10-CM

## 2016-04-07 NOTE — Telephone Encounter (Signed)
New orders were placed. Not able to cancel previous order.

## 2016-04-07 NOTE — Telephone Encounter (Signed)
-----   Message from Nori Riis, PA-C sent at 04/07/2016  8:00 AM EST ----- Please notify the patient that his urine culture is still pending, but he UA does not look like he has an infection.   We should pursue a CT Urogram at this time.  I need to know what he allergic reaction is to shrimp.

## 2016-04-07 NOTE — Telephone Encounter (Signed)
Spoke with pt in reference to u/a, cx, and shrimp allergy. Pt stated that shrimp allergy was a rash and it was several years ago. Pt then stated that this morning he is in a lot of pain. Pt stated that he feels like he has to pee but nothing will come out. Pt stated that he took a pain pill at 3:30 this morning and a second one at 5:30. Pain is still there but better. Pt also stated that he threw up a few minutes ago after drinking coffee. Reinforced with pt to drink plenty of water and take flomax as it sounds like he may be trying to pass a stone. Pt stated that he doesn't like the flomax due to the side effects he experiences. Reinforced with pt if s/s get worse to seek tx at the ER. Pt voiced understanding.

## 2016-04-07 NOTE — Telephone Encounter (Signed)
Sounds like he is passing a stone.  I suggest to change the order to a CT Renal stone study as he is having pain.

## 2016-04-09 LAB — CULTURE, URINE COMPREHENSIVE

## 2016-04-25 ENCOUNTER — Other Ambulatory Visit: Payer: Self-pay | Admitting: Urology

## 2016-05-09 ENCOUNTER — Encounter: Payer: Managed Care, Other (non HMO) | Admitting: Family Medicine

## 2016-05-31 ENCOUNTER — Encounter: Payer: Self-pay | Admitting: Family Medicine

## 2016-05-31 ENCOUNTER — Ambulatory Visit (INDEPENDENT_AMBULATORY_CARE_PROVIDER_SITE_OTHER): Payer: Managed Care, Other (non HMO) | Admitting: Family Medicine

## 2016-05-31 ENCOUNTER — Telehealth: Payer: Self-pay | Admitting: *Deleted

## 2016-05-31 VITALS — BP 118/86 | HR 77 | Temp 98.0°F | Resp 16 | Ht 70.0 in | Wt 232.2 lb

## 2016-05-31 DIAGNOSIS — Z87891 Personal history of nicotine dependence: Secondary | ICD-10-CM

## 2016-05-31 DIAGNOSIS — Z0000 Encounter for general adult medical examination without abnormal findings: Secondary | ICD-10-CM

## 2016-05-31 DIAGNOSIS — Z125 Encounter for screening for malignant neoplasm of prostate: Secondary | ICD-10-CM

## 2016-05-31 DIAGNOSIS — E669 Obesity, unspecified: Secondary | ICD-10-CM | POA: Diagnosis not present

## 2016-05-31 HISTORY — DX: Personal history of nicotine dependence: Z87.891

## 2016-05-31 LAB — COMPLETE METABOLIC PANEL WITH GFR
ALT: 27 U/L (ref 9–46)
AST: 23 U/L (ref 10–35)
Albumin: 4.2 g/dL (ref 3.6–5.1)
Alkaline Phosphatase: 33 U/L — ABNORMAL LOW (ref 40–115)
BILIRUBIN TOTAL: 0.6 mg/dL (ref 0.2–1.2)
BUN: 18 mg/dL (ref 7–25)
CALCIUM: 9.4 mg/dL (ref 8.6–10.3)
CHLORIDE: 105 mmol/L (ref 98–110)
CO2: 28 mmol/L (ref 20–31)
Creat: 1.12 mg/dL (ref 0.70–1.33)
GFR, EST AFRICAN AMERICAN: 84 mL/min (ref >=60)
GFR, EST NON AFRICAN AMERICAN: 73 mL/min (ref >=60)
Glucose, Bld: 113 mg/dL — ABNORMAL HIGH (ref 65–99)
Potassium: 4.7 mmol/L (ref 3.5–5.3)
Sodium: 139 mmol/L (ref 135–146)
Total Protein: 7.1 g/dL (ref 6.1–8.1)

## 2016-05-31 LAB — CBC WITH DIFFERENTIAL/PLATELET
BASOS ABS: 68 {cells}/uL (ref 0–200)
Basophils Relative: 1 %
EOS ABS: 68 {cells}/uL (ref 15–500)
Eosinophils Relative: 1 %
HCT: 44.5 % (ref 38.5–50.0)
Hemoglobin: 15.1 g/dL (ref 13.2–17.1)
LYMPHS PCT: 26 %
Lymphs Abs: 1768 cells/uL (ref 850–3900)
MCH: 32.3 pg (ref 27.0–33.0)
MCHC: 33.9 g/dL (ref 32.0–36.0)
MCV: 95.1 fL (ref 80.0–100.0)
MONOS PCT: 7 %
MPV: 9.8 fL (ref 7.5–12.5)
Monocytes Absolute: 476 cells/uL (ref 200–950)
NEUTROS PCT: 65 %
Neutro Abs: 4420 cells/uL (ref 1500–7800)
PLATELETS: 238 10*3/uL (ref 140–400)
RBC: 4.68 MIL/uL (ref 4.20–5.80)
RDW: 13.4 % (ref 11.0–15.0)
WBC: 6.8 10*3/uL (ref 3.8–10.8)

## 2016-05-31 LAB — LIPID PANEL
CHOL/HDL RATIO: 5.2 ratio — AB (ref <5.0)
CHOLESTEROL: 199 mg/dL (ref <200)
HDL: 38 mg/dL — AB (ref >40)
LDL Cholesterol: 135 mg/dL — ABNORMAL HIGH (ref <100)
TRIGLYCERIDES: 130 mg/dL (ref <150)
VLDL: 26 mg/dL (ref <30)

## 2016-05-31 LAB — TSH: TSH: 1.81 m[IU]/L (ref 0.40–4.50)

## 2016-05-31 LAB — PSA: PSA: 0.5 ng/mL (ref <=4.0)

## 2016-05-31 NOTE — Assessment & Plan Note (Signed)
Patient opted for PSA, no DRE

## 2016-05-31 NOTE — Assessment & Plan Note (Signed)
Encouraged weight loss 

## 2016-05-31 NOTE — Progress Notes (Signed)
Patient ID: [id number] Bean, male   DOB: [birth year], 56 y.o.   MRN: [id number]   Subjective:   Edward Bean is a 56 y.o. male here for a complete physical exam  Interim issues since last visit:he has had gross hematuria and has been evaluated by urologist; passed two stones and no problem since; right side was where the stone was; no kidney stones in the family; used to drink a lot of tea  USPSTF grade A and B recommendations Depression:  Depression screen [facility] 2/9 05/31/2016 05/05/2015 02/10/2015  Decreased Interest 0 0 0  Down, Depressed, Hopeless 0 0 0  PHQ - 2 Score 0 0 0   Hypertension: controlled Obesity: BMI > 30; discussed, trying to lose weight; highest was 240 pounds Alcohol: not like he used to, less than 5 a month Tobacco use: discussed HIV, hep B, hep C: negative STD testing and prevention (chl/gon/syphilis): declined Lipids: today Glucose: today Colorectal cancer: 2014; ten year pass Prostate cancer: do PSA today Breast cancer: no lumps Lung cancer: quit 4 months ago; he smoked 1 ppd for 30+ years Osteoporosis: n/a AAA: n/a Aspirin: do risk assessment Diet: loves greens, "I can eat anything", likes fruits and veggies Exercise: very active, no routine Skin cancer: sees dermatologist, has had a place removed from the face  Past Medical History:  Diagnosis Date  . Asthma    as child  . Family history of premature CAD 04/02/2013  . Hx of tobacco use, presenting hazards to health 05/31/2016   Quit Dec 2017  . Hyperlipidemia LDL goal <130 04/02/2013  . Hypertension, essential   . Impaired glucose tolerance in obese   . Kidney stone   . Metabolic syndrome    Obese; HLD (TG 243), Low HDL (34), HTN  . Obesity (BMI 30-39.9) 04/02/2013  . OSA (obstructive sleep apnea)    Not currently on CPAP; clinical diagnosis by PCP  . Sleep apnea   . Smoking greater than 10 pack years 04/02/2013  . Syncope    Past Surgical History:  Procedure Laterality Date  . KNEE SURGERY     Extensive L Knee surgery  . Treadmill Myoview  January 12 0.15   Exercise 10 min; 12 METS (excellent exercise tolerance)) - read 169 bpm (101% of max predicted) - no ECG or scintigraphic evidence of ischemia or infarction   Family History  Problem Relation Age of Onset  . Esophageal cancer Mother   . Heart attack Mother 74  . Hypertension Mother   . Bladder Cancer Mother   . Cancer Mother   . Lung cancer Father     Long term smoker  . Heart attack Father 28    MI -> ~12 yrs later --> CABG  . Hyperlipidemia Father   . Kidney disease Neg Hx   . Prostate cancer Neg Hx    Social History  Substance Use Topics  . Smoking status: Former Smoker    Packs/day: 0.50    Years: 10.00    Types: Cigarettes    Quit date: 03/31/2013  . Smokeless tobacco: Former Systems developer    Quit date: 03/24/2013     Comment: Smoked off & on for ~20 yrs prior to the last ~10 yr spell  . Alcohol use 0.0 oz/week     Comment: ocassional; usually not bothered by 2-3 beers +/- mixed drinks   Review of Systems  Constitutional: Negative for unexpected weight change.  Cardiovascular: Negative for chest pain.    Objective:   Vitals:  05/31/16 0826  BP: 118/86  Pulse: 77  Resp: 16  Temp: 98 F (36.7 C)  TempSrc: Oral  SpO2: 97%  Weight: 232 lb 3.2 oz (105.3 kg)  Height: 5\' 10"  (1.778 m)   Body mass index is 33.32 kg/m. Wt Readings from Last 3 Encounters:  05/31/16 232 lb 3.2 oz (105.3 kg)  04/06/16 237 lb 11.2 oz (107.8 kg)  03/14/16 233 lb 9.6 oz (106 kg)   Physical Exam  Constitutional: He appears well-developed and well-nourished. No distress.  obese  HENT:  Head: Normocephalic and atraumatic.  Nose: Nose normal.  Mouth/Throat: Oropharynx is clear and moist.  Eyes: EOM are normal. No scleral icterus.  Neck: No JVD present. No thyromegaly present.  Cardiovascular: Normal rate, regular rhythm and normal heart sounds.   Pulmonary/Chest: Effort normal and breath sounds normal. No respiratory  distress. He has no wheezes. He has no rales.  Abdominal: Soft. Bowel sounds are normal. He exhibits no distension. There is no tenderness. There is no guarding.  Musculoskeletal: Normal range of motion. He exhibits no edema.  Lymphadenopathy:    He has no cervical adenopathy.  Neurological: He is alert. He displays normal reflexes. He exhibits normal muscle tone. Coordination normal.  Skin: Skin is warm and dry. No rash noted. He is not diaphoretic. No erythema. No pallor.  Nailbeds not quite as pink as examiner  Psychiatric: He has a normal mood and affect. His behavior is normal. Judgment and thought content normal. His mood appears not anxious. He does not exhibit a depressed mood.    Assessment/Plan:   Problem List Items Addressed This Visit      Other   Prostate cancer screening    Patient opted for PSA, no DRE      Relevant Orders   PSA   Obesity (BMI 30-39.9) (Chronic)    Encouraged weight loss      Hx of tobacco use, presenting hazards to health    Order low dose chest CT      Relevant Orders   CT CHEST LUNG CA SCREEN LOW DOSE W/O CM   Annual physical exam - Primary    USPSTF grade A and B recommendations reviewed with patient; age-appropriate recommendations, preventive care, screening tests, etc discussed and encouraged; healthy living encouraged; see AVS for patient education given to patient       Relevant Orders   Lipid panel   CBC with Differential/Platelet   COMPLETE METABOLIC PANEL WITH GFR   TSH      No orders of the defined types were placed in this encounter.  Orders Placed This Encounter  Procedures  . CT CHEST LUNG CA SCREEN LOW DOSE W/O CM    Order Specific Question:   Reason for Exam (SYMPTOM  OR DIAGNOSIS REQUIRED)    Answer:   he thinks he may have smoked 30 pack year    Order Specific Question:   Preferred Imaging Location?    Answer:   [facility]  . PSA  . Lipid panel  . CBC with Differential/Platelet  .  COMPLETE METABOLIC PANEL WITH GFR  . TSH   Follow up plan: Return in about 1 year (around 05/31/2017) for complete physical.  An After Visit Summary was printed and given to the patient.

## 2016-05-31 NOTE — Patient Instructions (Addendum)
Check out the information at [email or web address] entitled "Nutrition for Weight Loss: What You Need to Know about Fad Diets" Try to lose between 1-2 pounds per week by taking in fewer calories and burning off more calories You can succeed by limiting portions, limiting foods dense in calories and fat, becoming more active, and drinking 8 glasses of water a day (64 ounces) Don't skip meals, especially breakfast, as skipping meals may alter your metabolism Do not use over-the-counter weight loss pills or gimmicks that claim rapid weight loss A healthy BMI (or body mass index) is between 18.5 and 24.9 You can calculate your ideal BMI at the [employer] website [email or web address] I've put in a referral for a low dose chest CT and they'll contact you We'll get labs today If you have not heard anything from my staff in a week about any orders/referrals/studies from today, please contact us here to follow-up [phone]   Health Maintenance, Male A healthy lifestyle and preventive care is important for your health and wellness. Ask your health care provider about what schedule of regular examinations is right for you. What should I know about weight and diet?  Eat a Healthy Diet  Eat plenty of vegetables, fruits, whole grains, low-fat dairy products, and lean protein.  Do not eat a lot of foods high in solid fats, added sugars, or salt. Maintain a Healthy Weight  Regular exercise can help you achieve or maintain a healthy weight. You should:  Do at least 150 minutes of exercise each week. The exercise should increase your heart rate and make you sweat (moderate-intensity exercise).  Do strength-training exercises at least twice a week. Watch Your Levels of Cholesterol and Blood Lipids  Have your blood tested for lipids and cholesterol every 5 years starting at 57 years of age. If you are at high risk for heart disease, you should start having your blood  tested when you are 57 years old. You may need to have your cholesterol levels checked more often if:  Your lipid or cholesterol levels are high.  You are older than 57 years of age.  You are at high risk for heart disease. What should I know about cancer screening? Many types of cancers can be detected early and may often be prevented. Lung Cancer  You should be screened every year for lung cancer if:  You are a current smoker who has smoked for at least 30 years.  You are a former smoker who has quit within the past 15 years.  Talk to your health care provider about your screening options, when you should start screening, and how often you should be screened. Colorectal Cancer  Routine colorectal cancer screening usually begins at 57 years of age and should be repeated every 5-10 years until you are 57 years old. You may need to be screened more often if early forms of precancerous polyps or small growths are found. Your health care provider may recommend screening at an earlier age if you have risk factors for colon cancer.  Your health care provider may recommend using home test kits to check for hidden blood in the stool.  A small camera at the end of a tube can be used to examine your colon (sigmoidoscopy or colonoscopy). This checks for the earliest forms of colorectal cancer. Prostate and Testicular Cancer  Depending on your age and overall health, your health care provider may do certain tests to screen for prostate and testicular cancer.  Talk to your health care provider  about any symptoms or concerns you have about testicular or prostate cancer. Skin Cancer  Check your skin from head to toe regularly.  Tell your health care provider about any new moles or changes in moles, especially if:  There is a change in a mole's size, shape, or color.  You have a mole that is larger than a pencil eraser.  Always use sunscreen. Apply sunscreen liberally and repeat throughout  the day.  Protect yourself by wearing long sleeves, pants, a wide-brimmed hat, and sunglasses when outside. What should I know about heart disease, diabetes, and high blood pressure?  If you are 50-45 years of age, have your blood pressure checked every 3-5 years. If you are 32 years of age or older, have your blood pressure checked every year. You should have your blood pressure measured twice-once when you are at a hospital or clinic, and once when you are not at a hospital or clinic. Record the average of the two measurements. To check your blood pressure when you are not at a hospital or clinic, you can use:  An automated blood pressure machine at a pharmacy.  A home blood pressure monitor.  Talk to your health care provider about your target blood pressure.  If you are between 36-11 years old, ask your health care provider if you should take aspirin to prevent heart disease.  Have regular diabetes screenings by checking your fasting blood sugar level.  If you are at a normal weight and have a low risk for diabetes, have this test once every three years after the age of 24.  If you are overweight and have a high risk for diabetes, consider being tested at a younger age or more often.  A one-time screening for abdominal aortic aneurysm (AAA) by ultrasound is recommended for men aged 29-75 years who are current or former smokers. What should I know about preventing infection? Hepatitis B  If you have a higher risk for hepatitis B, you should be screened for this virus. Talk with your health care provider to find out if you are at risk for hepatitis B infection. Hepatitis C  Blood testing is recommended for:  Everyone born from [birth year] through 1966.  Anyone with known risk factors for hepatitis C. Sexually Transmitted Diseases (STDs)  You should be screened each year for STDs including gonorrhea and chlamydia if:  You are sexually active and are younger than 57 years of age.  You  are older than 57 years of age and your health care provider tells you that you are at risk for this type of infection.  Your sexual activity has changed since you were last screened and you are at an increased risk for chlamydia or gonorrhea. Ask your health care provider if you are at risk.  Talk with your health care provider about whether you are at high risk of being infected with HIV. Your health care provider may recommend a prescription medicine to help prevent HIV infection. What else can I do?  Schedule regular health, dental, and eye exams.  Stay current with your vaccines (immunizations).  Do not use any tobacco products, such as cigarettes, chewing tobacco, and e-cigarettes. If you need help quitting, ask your health care provider.  Limit alcohol intake to no more than 2 drinks per day. One drink equals 12 ounces of beer, 5 ounces of wine, or 1 ounces of hard liquor.  Do not use street drugs.  Do not share needles.  Ask your health care  provider for help if you need support or information about quitting drugs.  Tell your health care provider if you often feel depressed.  Tell your health care provider if you have ever been abused or do not feel safe at home. This information is not intended to replace advice given to you by your health care provider. Make sure you discuss any questions you have with your health care provider. Document Released: 09/09/2007 Document Revised: 11/10/2015 Document Reviewed: 12/15/2014 Elsevier Interactive Patient Education  2017 [place] Maintenance, Male A healthy lifestyle and preventive care is important for your health and wellness. Ask your health care provider about what schedule of regular examinations is right for you. What should I know about weight and diet?  Eat a Healthy Diet  Eat plenty of vegetables, fruits, whole grains, low-fat dairy products, and lean protein.  Do not eat a lot of foods high in solid fats,  added sugars, or salt. Maintain a Healthy Weight  Regular exercise can help you achieve or maintain a healthy weight. You should:  Do at least 150 minutes of exercise each week. The exercise should increase your heart rate and make you sweat (moderate-intensity exercise).  Do strength-training exercises at least twice a week. Watch Your Levels of Cholesterol and Blood Lipids  Have your blood tested for lipids and cholesterol every 5 years starting at 56 years of age. If you are at high risk for heart disease, you should start having your blood tested when you are 57 years old. You may need to have your cholesterol levels checked more often if:  Your lipid or cholesterol levels are high.  You are older than 57 years of age.  You are at high risk for heart disease. What should I know about cancer screening? Many types of cancers can be detected early and may often be prevented. Lung Cancer  You should be screened every year for lung cancer if:  You are a current smoker who has smoked for at least 30 years.  You are a former smoker who has quit within the past 15 years.  Talk to your health care provider about your screening options, when you should start screening, and how often you should be screened. Colorectal Cancer  Routine colorectal cancer screening usually begins at 58 years of age and should be repeated every 5-10 years until you are 57 years old. You may need to be screened more often if early forms of precancerous polyps or small growths are found. Your health care provider may recommend screening at an earlier age if you have risk factors for colon cancer.  Your health care provider may recommend using home test kits to check for hidden blood in the stool.  A small camera at the end of a tube can be used to examine your colon (sigmoidoscopy or colonoscopy). This checks for the earliest forms of colorectal cancer. Prostate and Testicular Cancer  Depending on your age and  overall health, your health care provider may do certain tests to screen for prostate and testicular cancer.  Talk to your health care provider about any symptoms or concerns you have about testicular or prostate cancer. Skin Cancer  Check your skin from head to toe regularly.  Tell your health care provider about any new moles or changes in moles, especially if:  There is a change in a mole's size, shape, or color.  You have a mole that is larger than a pencil eraser.  Always use sunscreen. Apply  sunscreen liberally and repeat throughout the day.  Protect yourself by wearing long sleeves, pants, a wide-brimmed hat, and sunglasses when outside. What should I know about heart disease, diabetes, and high blood pressure?  If you are 78-63 years of age, have your blood pressure checked every 3-5 years. If you are 47 years of age or older, have your blood pressure checked every year. You should have your blood pressure measured twice-once when you are at a hospital or clinic, and once when you are not at a hospital or clinic. Record the average of the two measurements. To check your blood pressure when you are not at a hospital or clinic, you can use:  An automated blood pressure machine at a pharmacy.  A home blood pressure monitor.  Talk to your health care provider about your target blood pressure.  If you are between 17-30 years old, ask your health care provider if you should take aspirin to prevent heart disease.  Have regular diabetes screenings by checking your fasting blood sugar level.  If you are at a normal weight and have a low risk for diabetes, have this test once every three years after the age of 58.  If you are overweight and have a high risk for diabetes, consider being tested at a younger age or more often.  A one-time screening for abdominal aortic aneurysm (AAA) by ultrasound is recommended for men aged 54-75 years who are current or former smokers. What should I  know about preventing infection? Hepatitis B  If you have a higher risk for hepatitis B, you should be screened for this virus. Talk with your health care provider to find out if you are at risk for hepatitis B infection. Hepatitis C  Blood testing is recommended for:  Everyone born from [birth year] through 1966.  Anyone with known risk factors for hepatitis C. Sexually Transmitted Diseases (STDs)  You should be screened each year for STDs including gonorrhea and chlamydia if:  You are sexually active and are younger than 57 years of age.  You are older than 57 years of age and your health care provider tells you that you are at risk for this type of infection.  Your sexual activity has changed since you were last screened and you are at an increased risk for chlamydia or gonorrhea. Ask your health care provider if you are at risk.  Talk with your health care provider about whether you are at high risk of being infected with HIV. Your health care provider may recommend a prescription medicine to help prevent HIV infection. What else can I do?  Schedule regular health, dental, and eye exams.  Stay current with your vaccines (immunizations).  Do not use any tobacco products, such as cigarettes, chewing tobacco, and e-cigarettes. If you need help quitting, ask your health care provider.  Limit alcohol intake to no more than 2 drinks per day. One drink equals 12 ounces of beer, 5 ounces of wine, or 1 ounces of hard liquor.  Do not use street drugs.  Do not share needles.  Ask your health care provider for help if you need support or information about quitting drugs.  Tell your health care provider if you often feel depressed.  Tell your health care provider if you have ever been abused or do not feel safe at home. This information is not intended to replace advice given to you by your health care provider. Make sure you discuss any questions you have with your health  care  provider. Document Released: 09/09/2007 Document Revised: 11/10/2015 Document Reviewed: 12/15/2014 Elsevier Interactive Patient Education  2017 [place] Maintenance, Male A healthy lifestyle and preventive care is important for your health and wellness. Ask your health care provider about what schedule of regular examinations is right for you. What should I know about weight and diet?  Eat a Healthy Diet  Eat plenty of vegetables, fruits, whole grains, low-fat dairy products, and lean protein.  Do not eat a lot of foods high in solid fats, added sugars, or salt. Maintain a Healthy Weight  Regular exercise can help you achieve or maintain a healthy weight. You should:  Do at least 150 minutes of exercise each week. The exercise should increase your heart rate and make you sweat (moderate-intensity exercise).  Do strength-training exercises at least twice a week. Watch Your Levels of Cholesterol and Blood Lipids  Have your blood tested for lipids and cholesterol every 5 years starting at 57 years of age. If you are at high risk for heart disease, you should start having your blood tested when you are 57 years old. You may need to have your cholesterol levels checked more often if:  Your lipid or cholesterol levels are high.  You are older than 57 years of age.  You are at high risk for heart disease. What should I know about cancer screening? Many types of cancers can be detected early and may often be prevented. Lung Cancer  You should be screened every year for lung cancer if:  You are a current smoker who has smoked for at least 30 years.  You are a former smoker who has quit within the past 15 years.  Talk to your health care provider about your screening options, when you should start screening, and how often you should be screened. Colorectal Cancer  Routine colorectal cancer screening usually begins at 57 years of age and should be repeated every 5-10 years  until you are 57 years old. You may need to be screened more often if early forms of precancerous polyps or small growths are found. Your health care provider may recommend screening at an earlier age if you have risk factors for colon cancer.  Your health care provider may recommend using home test kits to check for hidden blood in the stool.  A small camera at the end of a tube can be used to examine your colon (sigmoidoscopy or colonoscopy). This checks for the earliest forms of colorectal cancer. Prostate and Testicular Cancer  Depending on your age and overall health, your health care provider may do certain tests to screen for prostate and testicular cancer.  Talk to your health care provider about any symptoms or concerns you have about testicular or prostate cancer. Skin Cancer  Check your skin from head to toe regularly.  Tell your health care provider about any new moles or changes in moles, especially if:  There is a change in a mole's size, shape, or color.  You have a mole that is larger than a pencil eraser.  Always use sunscreen. Apply sunscreen liberally and repeat throughout the day.  Protect yourself by wearing long sleeves, pants, a wide-brimmed hat, and sunglasses when outside. What should I know about heart disease, diabetes, and high blood pressure?  If you are 41-22 years of age, have your blood pressure checked every 3-5 years. If you are 7 years of age or older, have your blood pressure checked every year. You should have  your blood pressure measured twice-once when you are at a hospital or clinic, and once when you are not at a hospital or clinic. Record the average of the two measurements. To check your blood pressure when you are not at a hospital or clinic, you can use:  An automated blood pressure machine at a pharmacy.  A home blood pressure monitor.  Talk to your health care provider about your target blood pressure.  If you are between 65-79 years  old, ask your health care provider if you should take aspirin to prevent heart disease.  Have regular diabetes screenings by checking your fasting blood sugar level.  If you are at a normal weight and have a low risk for diabetes, have this test once every three years after the age of 16.  If you are overweight and have a high risk for diabetes, consider being tested at a younger age or more often.  A one-time screening for abdominal aortic aneurysm (AAA) by ultrasound is recommended for men aged 58-75 years who are current or former smokers. What should I know about preventing infection? Hepatitis B  If you have a higher risk for hepatitis B, you should be screened for this virus. Talk with your health care provider to find out if you are at risk for hepatitis B infection. Hepatitis C  Blood testing is recommended for:  Everyone born from [birth year] through 1966.  Anyone with known risk factors for hepatitis C. Sexually Transmitted Diseases (STDs)  You should be screened each year for STDs including gonorrhea and chlamydia if:  You are sexually active and are younger than 57 years of age.  You are older than 57 years of age and your health care provider tells you that you are at risk for this type of infection.  Your sexual activity has changed since you were last screened and you are at an increased risk for chlamydia or gonorrhea. Ask your health care provider if you are at risk.  Talk with your health care provider about whether you are at high risk of being infected with HIV. Your health care provider may recommend a prescription medicine to help prevent HIV infection. What else can I do?  Schedule regular health, dental, and eye exams.  Stay current with your vaccines (immunizations).  Do not use any tobacco products, such as cigarettes, chewing tobacco, and e-cigarettes. If you need help quitting, ask your health care provider.  Limit alcohol intake to no more than 2 drinks per  day. One drink equals 12 ounces of beer, 5 ounces of wine, or 1 ounces of hard liquor.  Do not use street drugs.  Do not share needles.  Ask your health care provider for help if you need support or information about quitting drugs.  Tell your health care provider if you often feel depressed.  Tell your health care provider if you have ever been abused or do not feel safe at home. This information is not intended to replace advice given to you by your health care provider. Make sure you discuss any questions you have with your health care provider. Document Released: 09/09/2007 Document Revised: 11/10/2015 Document Reviewed: 12/15/2014 Elsevier Interactive Patient Education  2017 [place] Maintenance, Male A healthy lifestyle and preventive care is important for your health and wellness. Ask your health care provider about what schedule of regular examinations is right for you. What should I know about weight and diet?  Eat a Healthy Diet  Eat plenty of  vegetables, fruits, whole grains, low-fat dairy products, and lean protein.  Do not eat a lot of foods high in solid fats, added sugars, or salt. Maintain a Healthy Weight  Regular exercise can help you achieve or maintain a healthy weight. You should:  Do at least 150 minutes of exercise each week. The exercise should increase your heart rate and make you sweat (moderate-intensity exercise).  Do strength-training exercises at least twice a week. Watch Your Levels of Cholesterol and Blood Lipids  Have your blood tested for lipids and cholesterol every 5 years starting at 57 years of age. If you are at high risk for heart disease, you should start having your blood tested when you are 57 years old. You may need to have your cholesterol levels checked more often if:  Your lipid or cholesterol levels are high.  You are older than 57 years of age.  You are at high risk for heart disease. What should I know about cancer  screening? Many types of cancers can be detected early and may often be prevented. Lung Cancer  You should be screened every year for lung cancer if:  You are a current smoker who has smoked for at least 30 years.  You are a former smoker who has quit within the past 15 years.  Talk to your health care provider about your screening options, when you should start screening, and how often you should be screened. Colorectal Cancer  Routine colorectal cancer screening usually begins at 57 years of age and should be repeated every 5-10 years until you are 57 years old. You may need to be screened more often if early forms of precancerous polyps or small growths are found. Your health care provider may recommend screening at an earlier age if you have risk factors for colon cancer.  Your health care provider may recommend using home test kits to check for hidden blood in the stool.  A small camera at the end of a tube can be used to examine your colon (sigmoidoscopy or colonoscopy). This checks for the earliest forms of colorectal cancer. Prostate and Testicular Cancer  Depending on your age and overall health, your health care provider may do certain tests to screen for prostate and testicular cancer.  Talk to your health care provider about any symptoms or concerns you have about testicular or prostate cancer. Skin Cancer  Check your skin from head to toe regularly.  Tell your health care provider about any new moles or changes in moles, especially if:  There is a change in a mole's size, shape, or color.  You have a mole that is larger than a pencil eraser.  Always use sunscreen. Apply sunscreen liberally and repeat throughout the day.  Protect yourself by wearing long sleeves, pants, a wide-brimmed hat, and sunglasses when outside. What should I know about heart disease, diabetes, and high blood pressure?  If you are 61-59 years of age, have your blood pressure checked every 3-5  years. If you are 76 years of age or older, have your blood pressure checked every year. You should have your blood pressure measured twice-once when you are at a hospital or clinic, and once when you are not at a hospital or clinic. Record the average of the two measurements. To check your blood pressure when you are not at a hospital or clinic, you can use:  An automated blood pressure machine at a pharmacy.  A home blood pressure monitor.  Talk to your health  care provider about your target blood pressure.  If you are between 51-43 years old, ask your health care provider if you should take aspirin to prevent heart disease.  Have regular diabetes screenings by checking your fasting blood sugar level.  If you are at a normal weight and have a low risk for diabetes, have this test once every three years after the age of 2.  If you are overweight and have a high risk for diabetes, consider being tested at a younger age or more often.  A one-time screening for abdominal aortic aneurysm (AAA) by ultrasound is recommended for men aged 51-75 years who are current or former smokers. What should I know about preventing infection? Hepatitis B  If you have a higher risk for hepatitis B, you should be screened for this virus. Talk with your health care provider to find out if you are at risk for hepatitis B infection. Hepatitis C  Blood testing is recommended for:  Everyone born from [birth year] through 1966.  Anyone with known risk factors for hepatitis C. Sexually Transmitted Diseases (STDs)  You should be screened each year for STDs including gonorrhea and chlamydia if:  You are sexually active and are younger than 57 years of age.  You are older than 57 years of age and your health care provider tells you that you are at risk for this type of infection.  Your sexual activity has changed since you were last screened and you are at an increased risk for chlamydia or gonorrhea. Ask your health  care provider if you are at risk.  Talk with your health care provider about whether you are at high risk of being infected with HIV. Your health care provider may recommend a prescription medicine to help prevent HIV infection. What else can I do?  Schedule regular health, dental, and eye exams.  Stay current with your vaccines (immunizations).  Do not use any tobacco products, such as cigarettes, chewing tobacco, and e-cigarettes. If you need help quitting, ask your health care provider.  Limit alcohol intake to no more than 2 drinks per day. One drink equals 12 ounces of beer, 5 ounces of wine, or 1 ounces of hard liquor.  Do not use street drugs.  Do not share needles.  Ask your health care provider for help if you need support or information about quitting drugs.  Tell your health care provider if you often feel depressed.  Tell your health care provider if you have ever been abused or do not feel safe at home. This information is not intended to replace advice given to you by your health care provider. Make sure you discuss any questions you have with your health care provider. Document Released: 09/09/2007 Document Revised: 11/10/2015 Document Reviewed: 12/15/2014 Elsevier Interactive Patient Education  2017 [employer].

## 2016-05-31 NOTE — Assessment & Plan Note (Signed)
Order low dose chest CT

## 2016-05-31 NOTE — Assessment & Plan Note (Signed)
USPSTF grade A and B recommendations reviewed with patient; age-appropriate recommendations, preventive care, screening tests, etc discussed and encouraged; healthy living encouraged; see AVS for patient education given to patient  

## 2016-05-31 NOTE — Telephone Encounter (Signed)
Received referral for low dose lung cancer screening CT scan. Voicemail left at phone number listed in EMR for patient to call me back to facilitate scheduling scan.  

## 2016-06-01 ENCOUNTER — Other Ambulatory Visit: Payer: Self-pay | Admitting: Family Medicine

## 2016-06-01 DIAGNOSIS — E785 Hyperlipidemia, unspecified: Secondary | ICD-10-CM

## 2016-06-01 NOTE — Progress Notes (Signed)
Orders for lipids for 3 months after TLC

## 2016-06-01 NOTE — Assessment & Plan Note (Signed)
Check lipids in 3 months after TLC

## 2016-06-06 ENCOUNTER — Telehealth: Payer: Self-pay | Admitting: *Deleted

## 2016-06-06 DIAGNOSIS — Z87891 Personal history of nicotine dependence: Secondary | ICD-10-CM

## 2016-06-06 NOTE — Telephone Encounter (Signed)
Received referral for initial lung cancer screening scan. Contacted patient and obtained smoking history,(former, quit 02/25/16, 30 pack year) as well as answering questions related to screening process. Patient denies signs of lung cancer such as weight loss or hemoptysis. Patient denies comorbidity that would prevent curative treatment if lung cancer were found. Patient is tentatively scheduled for shared decision making visit and CT scan on 06/13/16, pending insurance approval from business office.

## 2016-06-13 ENCOUNTER — Inpatient Hospital Stay: Payer: Managed Care, Other (non HMO) | Attending: Oncology | Admitting: Oncology

## 2016-06-13 ENCOUNTER — Encounter: Payer: Self-pay | Admitting: Oncology

## 2016-06-13 ENCOUNTER — Other Ambulatory Visit: Payer: Self-pay | Admitting: *Deleted

## 2016-06-13 ENCOUNTER — Ambulatory Visit: Payer: Managed Care, Other (non HMO)

## 2016-06-13 ENCOUNTER — Ambulatory Visit
Admission: RE | Admit: 2016-06-13 | Discharge: 2016-06-13 | Disposition: A | Discharge status: Home / Self Care | Payer: Managed Care, Other (non HMO) | Source: Ambulatory Visit | Attending: Oncology | Admitting: Oncology

## 2016-06-13 DIAGNOSIS — I700 Atherosclerosis of aorta: Secondary | ICD-10-CM | POA: Diagnosis not present

## 2016-06-13 DIAGNOSIS — I2510 Atherosclerotic heart disease of native coronary artery without angina pectoris: Secondary | ICD-10-CM | POA: Insufficient documentation

## 2016-06-13 DIAGNOSIS — Z122 Encounter for screening for malignant neoplasm of respiratory organs: Secondary | ICD-10-CM

## 2016-06-13 DIAGNOSIS — Z87891 Personal history of nicotine dependence: Secondary | ICD-10-CM | POA: Diagnosis not present

## 2016-06-14 ENCOUNTER — Encounter: Payer: Self-pay | Admitting: *Deleted

## 2016-06-18 NOTE — Progress Notes (Signed)
In accordance with CMS guidelines, patient has met eligibility criteria including age, absence of signs or symptoms of lung cancer.  Social History  Substance Use Topics  . Smoking status: Former Smoker    Packs/day: 1.00    Years: 30.00    Types: Cigarettes    Quit date: 02/25/2016  . Smokeless tobacco: Former Systems developer    Quit date: 03/24/2013     Comment: Smoked off & on for ~20 yrs prior to the last ~10 yr spell  . Alcohol use 0.0 oz/week     Comment: ocassional; usually not bothered by 2-3 beers +/- mixed drinks     A shared decision-making session was conducted prior to the performance of CT scan. This includes one or more decision aids, includes benefits and harms of screening, follow-up diagnostic testing, over-diagnosis, false positive rate, and total radiation exposure.  Counseling on the importance of adherence to annual lung cancer LDCT screening, impact of co-morbidities, and ability or willingness to undergo diagnosis and treatment is imperative for compliance of the program.  Counseling on the importance of continued smoking cessation for former smokers; the importance of smoking cessation for current smokers, and information about tobacco cessation interventions have been given to patient including [place] and [facility] quit [place] programs.  Written order for lung cancer screening with LDCT has been given to the patient and any and all questions have been answered to the best of my abilities.   Yearly follow up will be coordinated by Burgess Estelle, Thoracic Navigator.

## 2016-06-20 ENCOUNTER — Telehealth: Payer: Self-pay | Admitting: Family Medicine

## 2016-06-20 NOTE — Telephone Encounter (Signed)
Please schedule patient for an appointment to discuss his CT scan results Within the next two weeks is great In the meantime, please recommend he take a coated 81 mg aspirin daily for heart protection Thank you

## 2016-06-21 NOTE — Telephone Encounter (Signed)
Pt instructed on how to take the aspirin and is scheduled

## 2016-06-27 ENCOUNTER — Encounter: Payer: Self-pay | Admitting: Family Medicine

## 2016-06-27 ENCOUNTER — Ambulatory Visit (INDEPENDENT_AMBULATORY_CARE_PROVIDER_SITE_OTHER): Payer: Managed Care, Other (non HMO) | Admitting: Family Medicine

## 2016-06-27 DIAGNOSIS — E785 Hyperlipidemia, unspecified: Secondary | ICD-10-CM

## 2016-06-27 DIAGNOSIS — E669 Obesity, unspecified: Secondary | ICD-10-CM

## 2016-06-27 DIAGNOSIS — I2510 Atherosclerotic heart disease of native coronary artery without angina pectoris: Secondary | ICD-10-CM

## 2016-06-27 DIAGNOSIS — I700 Atherosclerosis of aorta: Secondary | ICD-10-CM | POA: Diagnosis not present

## 2016-06-27 DIAGNOSIS — R918 Other nonspecific abnormal finding of lung field: Secondary | ICD-10-CM | POA: Diagnosis not present

## 2016-06-27 HISTORY — DX: Atherosclerosis of aorta: I70.0

## 2016-06-27 HISTORY — DX: Other nonspecific abnormal finding of lung field: R91.8

## 2016-06-27 HISTORY — DX: Atherosclerotic heart disease of native coronary artery without angina pectoris: I25.10

## 2016-06-27 NOTE — Patient Instructions (Addendum)
We'll have you see Dr. Ellyn Hack Try to limit saturated fats in your diet (bologna, hot dogs, barbeque, cheeseburgers, hamburgers, steak, bacon, sausage, cheese, etc.) and get more fresh fruits, vegetables, and whole grains Check out the information at familydoctor.org entitled "Nutrition for Weight Loss: What You Need to Know about Fad Diets" Try to lose between 1-2 pounds per week by taking in fewer calories and burning off more calories You can succeed by limiting portions, limiting foods dense in calories and fat, becoming more active, and drinking 8 glasses of water a day (64 ounces) Don't skip meals, especially breakfast, as skipping meals may alter your metabolism Do not use over-the-counter weight loss pills or gimmicks that claim rapid weight loss A healthy BMI (or body mass index) is between 18.5 and 24.9 You can calculate your ideal BMI at the NIH website ClubMonetize.fr   Cholesterol Cholesterol is a fat. Your body needs a small amount of cholesterol. Cholesterol (plaque) may build up in your blood vessels (arteries). That makes you more likely to have a heart attack or stroke. You cannot feel your cholesterol level. Having a blood test is the only way to find out if your level is high. Keep your test results. Work with your doctor to keep your cholesterol at a good level. What do the results mean?  Total cholesterol is how much cholesterol is in your blood.  LDL is bad cholesterol. This is the type that can build up. Try to have low LDL.  HDL is good cholesterol. It cleans your blood vessels and carries LDL away. Try to have high HDL.  Triglycerides are fat that the body can store or burn for energy. What are good levels of cholesterol?  Total cholesterol below 200.  LDL below 100 is good for people who have health risks. LDL below 70 is good for people who have very high risks.  HDL above 40 is good. It is best to have HDL  of 60 or higher.  Triglycerides below 150. How can I lower my cholesterol? Diet  Follow your diet program as told by your doctor.  Choose fish, white meat chicken, or [place] that is roasted or baked. Try not to eat red meat, fried foods, sausage, or lunch meats.  Eat lots of fresh fruits and vegetables.  Choose whole grains, beans, pasta, potatoes, and cereals.  Choose olive oil, corn oil, or canola oil. Only use small amounts.  Try not to eat butter, mayonnaise, shortening, or palm kernel oils.  Try not to eat foods with trans fats.  Choose low-fat or nonfat dairy foods.  Drink skim or nonfat milk.  Eat low-fat or nonfat yogurt and cheeses.  Try not to drink whole milk or cream.  Try not to eat ice cream, egg yolks, or full-fat cheeses.  Healthy desserts include angel food cake, ginger snaps, animal crackers, hard candy, popsicles, and low-fat or nonfat frozen yogurt. Try not to eat pastries, cakes, pies, and cookies. Exercise  Follow your exercise program as told by your doctor.  Be more active. Try gardening, walking, and taking the stairs.  Ask your doctor about ways that you can be more active. Medicine  Take over-the-counter and prescription medicines only as told by your doctor. This information is not intended to replace advice given to you by your health care provider. Make sure you discuss any questions you have with your health care provider. Document Released: 06/09/2008 Document Revised: 10/13/2015 Document Reviewed: 09/23/2015 Elsevier Interactive Patient Education  2017 [employer].

## 2016-06-27 NOTE — Assessment & Plan Note (Signed)
Noted on chest CT; refer back to cardiologist for evaluation, work on weight loss; 81 mg coated aspirin after finishing the 325 mg (no bleeding); recommended statin, but patient wants to try diet; cited data that getting LDL under 70 can result in plaque regression; discussed plant-based diet, limiting saturated fats

## 2016-06-27 NOTE — Assessment & Plan Note (Signed)
Goal LDL less than 70 

## 2016-06-27 NOTE — Assessment & Plan Note (Signed)
Work on [employer] loss, encouragement given

## 2016-06-27 NOTE — Assessment & Plan Note (Signed)
Statin recommended; he declined; goal LDL less than 70; limit saturated fats; discussed plant-based diet as means to lower LDL

## 2016-06-27 NOTE — Progress Notes (Signed)
BP 120/82   Pulse 73   Temp 97.8 F (36.6 C) (Oral)   Resp 16   Wt 237 lb 8 oz (107.7 kg)   SpO2 98%   BMI 34.08 kg/m    Subjective:    Patient ID: Edward Bean, male    DOB: 1960-04-24, 56 y.o.   MRN: [id number]  HPI: Edward Bean is a 56 y.o. male  Chief Complaint  Patient presents with  . CT Results    Discuss   Patient is here for discussion of his CT scan He quit once for five years and once for three years; quit for good in November 2017 He gained a little weight after quitting He has a cardiologist, Dr. Glenetta Hew; he has had two stress tests; first one was fine; second was fine, maybe two years; actual date was 04/07/2013 Occasional hiccup in his chest that he attributes to gas; reflux problems, little belch that bruns Multiple pulmonary nodules found as well, bilateral  Lab Results  Component Value Date   CHOL 199 05/31/2016   CHOL 181 05/05/2015   CHOL 195 02/12/2015   Lab Results  Component Value Date   HDL 38 (L) 05/31/2016   HDL 35 (L) 05/05/2015   HDL 32 (L) 02/12/2015   Lab Results  Component Value Date   LDLCALC 135 (H) 05/31/2016   LDLCALC 123 (H) 05/05/2015   LDLCALC 130 (H) 02/12/2015   Lab Results  Component Value Date   TRIG 130 05/31/2016   TRIG 117 05/05/2015   TRIG 166 (H) 02/12/2015   Lab Results  Component Value Date   CHOLHDL 5.2 (H) 05/31/2016   CHOLHDL 5.2 (H) 05/05/2015   CHOLHDL 6.1 (H) 02/12/2015   No results found for: LDLDIRECT   CLINICAL DATA:  56 year old male former smoker (quit 4 months ago) with 30 pack-year history of smoking. Lung cancer screening examination.  EXAM: CT CHEST WITHOUT CONTRAST LOW-DOSE FOR LUNG CANCER SCREENING  TECHNIQUE: Multidetector CT imaging of the chest was performed following the standard protocol without IV contrast.  COMPARISON:  No priors.  FINDINGS: Cardiovascular: Heart size is normal. There is no significant pericardial fluid, thickening or pericardial  calcification. There is aortic atherosclerosis, as well as atherosclerosis of the great vessels of the mediastinum and the coronary arteries, including calcified atherosclerotic plaque in the right coronary artery.  Mediastinum/Nodes: No pathologically enlarged mediastinal or hilar lymph nodes. Please note that accurate exclusion of hilar adenopathy is limited on noncontrast CT scans. Esophagus is unremarkable in appearance. No axillary lymphadenopathy.  Lungs/Pleura: Several small pulmonary nodules are noted throughout the lungs bilaterally, the largest of which has a volume derived mean diameter of only 5.8 mm in the periphery of the left lower lobe (axial image 169 of series 3). No other larger more suspicious appearing pulmonary nodules or masses. No acute consolidative airspace disease. No pleural effusions.  Upper Abdomen: Unremarkable.  Musculoskeletal: There are no aggressive appearing lytic or blastic lesions noted in the visualized portions of the skeleton.  IMPRESSION: 1. Lung-RADS Category 2S, benign appearance or behavior. Continue annual screening with low-dose chest CT without contrast in 12 months. 2. The "S" modifier above refers to potentially clinically significant non lung cancer related findings. Specifically, there is aortic atherosclerosis, in addition to right coronary artery disease. Please note that although the presence of coronary artery calcium documents the presence of coronary artery disease, the severity of this disease and any potential stenosis cannot be assessed on this non-gated CT examination.  Assessment for potential risk factor modification, dietary therapy or pharmacologic therapy may be warranted, if clinically indicated.   Electronically Signed   By: Vinnie Langton M.D.   On: 06/13/2016 17:13   Depression screen [facility] 2/9 06/27/2016 05/31/2016 05/05/2015 02/10/2015  Decreased Interest 0 0 0 0  Down, Depressed, Hopeless 0 0 0 0    PHQ - 2 Score 0 0 0 0    Relevant past medical, surgical, family and social history reviewed Past Medical History:  Diagnosis Date  . Aortic atherosclerosis ([place]) 06/27/2016   Chest CT march 2018  . Asthma    as child  . Coronary artery disease 06/27/2016  . Family history of premature CAD 04/02/2013  . Hx of tobacco use, presenting hazards to health 05/31/2016   Quit Dec 2017  . Hyperlipidemia LDL goal <130 04/02/2013  . Hypertension, essential   . Impaired glucose tolerance in obese   . Kidney stone   . Metabolic syndrome    Obese; HLD (TG 243), Low HDL (34), HTN  . Obesity (BMI 30-39.9) 04/02/2013  . OSA (obstructive sleep apnea)    Not currently on CPAP; clinical diagnosis by PCP  . Pulmonary nodules/lesions, multiple 06/27/2016   Chest CT 2018  . Sleep apnea   . Smoking greater than 10 pack years 04/02/2013  . Syncope    Past Surgical History:  Procedure Laterality Date  . KNEE SURGERY     Extensive L Knee surgery  . Treadmill Myoview  January 12 0.15   Exercise 10 min; 12 METS (excellent exercise tolerance)) - read 169 bpm (101% of max predicted) - no ECG or scintigraphic evidence of ischemia or infarction   Family History  Problem Relation Age of Onset  . Esophageal cancer Mother   . Heart attack Mother 68  . Hypertension Mother   . Bladder Cancer Mother   . Cancer Mother   . Lung cancer Father     Long term smoker  . Heart attack Father 26    MI -> ~12 yrs later --> CABG  . Hyperlipidemia Father   . Kidney disease Neg Hx   . Prostate cancer Neg Hx    Social History  Substance Use Topics  . Smoking status: Former Smoker    Packs/day: 1.00    Years: 30.00    Types: Cigarettes    Quit date: 02/25/2016  . Smokeless tobacco: Former Systems developer    Quit date: 03/24/2013     Comment: Smoked off & on for ~20 yrs prior to the last ~10 yr spell  . Alcohol use 0.0 oz/week     Comment: ocassional; usually not bothered by 2-3 beers +/- mixed drinks    Interim medical history  since last visit reviewed. Allergies and medications reviewed  Review of Systems Per HPI unless specifically indicated above     Objective:    BP 120/82   Pulse 73   Temp 97.8 F (36.6 C) (Oral)   Resp 16   Wt 237 lb 8 oz (107.7 kg)   SpO2 98%   BMI 34.08 kg/m   Wt Readings from Last 3 Encounters:  06/27/16 237 lb 8 oz (107.7 kg)  05/31/16 232 lb 3.2 oz (105.3 kg)  04/06/16 237 lb 11.2 oz (107.8 kg)    Physical Exam  Constitutional: He appears well-developed and well-nourished. No distress.  obese  HENT:  Head: Normocephalic and atraumatic.  Eyes: EOM are normal. No scleral icterus.  Neck: Carotid bruit is not present. No thyromegaly present.  Cardiovascular: Normal rate and regular rhythm.   Pulmonary/Chest: Effort normal and breath sounds normal.  Abdominal: He exhibits no distension.  Neurological: Coordination normal.  Skin: Skin is warm and dry. No pallor.  Psychiatric: He has a normal mood and affect. His behavior is normal. Judgment and thought content normal.    Results for orders placed or performed in visit on 05/31/16  PSA  Result Value Ref Range   PSA 0.5 <=4.0 ng/mL  Lipid panel  Result Value Ref Range   Cholesterol 199 <200 mg/dL   Triglycerides 130 <150 mg/dL   HDL 38 (L) >40 mg/dL   Total CHOL/HDL Ratio 5.2 (H) <5.0 Ratio   VLDL 26 <30 mg/dL   LDL Cholesterol 135 (H) <100 mg/dL  CBC with Differential/Platelet  Result Value Ref Range   WBC 6.8 3.8 - 10.8 K/uL   RBC 4.68 4.20 - 5.80 MIL/uL   Hemoglobin 15.1 13.2 - 17.1 g/dL   HCT 44.5 38.5 - 50.0 %   MCV 95.1 80.0 - 100.0 fL   MCH 32.3 27.0 - 33.0 pg   MCHC 33.9 32.0 - 36.0 g/dL   RDW 13.4 11.0 - 15.0 %   Platelets 238 140 - 400 K/uL   MPV 9.8 7.5 - 12.5 fL   Neutro Abs 4,420 1,500 - 7,800 cells/uL   Lymphs Abs 1,768 850 - 3,900 cells/uL   Monocytes Absolute 476 200 - 950 cells/uL   Eosinophils Absolute 68 15 - 500 cells/uL   Basophils Absolute 68 0 - 200 cells/uL   Neutrophils Relative  % 65 %   Lymphocytes Relative 26 %   Monocytes Relative 7 %   Eosinophils Relative 1 %   Basophils Relative 1 %   Smear Review Criteria for review not met   COMPLETE METABOLIC PANEL WITH GFR  Result Value Ref Range   Sodium 139 135 - 146 mmol/L   Potassium 4.7 3.5 - 5.3 mmol/L   Chloride 105 98 - 110 mmol/L   CO2 28 20 - 31 mmol/L   Glucose, Bld 113 (H) 65 - 99 mg/dL   BUN 18 7 - 25 mg/dL   Creat 1.12 0.70 - 1.33 mg/dL   Total Bilirubin 0.6 0.2 - 1.2 mg/dL   Alkaline Phosphatase 33 (L) 40 - 115 U/L   AST 23 10 - 35 U/L   ALT 27 9 - 46 U/L   Total Protein 7.1 6.1 - 8.1 g/dL   Albumin 4.2 3.6 - 5.1 g/dL   Calcium 9.4 8.6 - 10.3 mg/dL   GFR, Est African American 84 >=60 mL/min   GFR, Est Non African American 73 >=60 mL/min  TSH  Result Value Ref Range   TSH 1.81 0.40 - 4.50 mIU/L      Assessment & Plan:   Problem List Items Addressed This Visit      Cardiovascular and Mediastinum   Coronary artery disease    Noted on chest CT; refer back to cardiologist for evaluation, work on weight loss; 81 mg coated aspirin after finishing the 325 mg (no bleeding); recommended statin, but patient wants to try diet; cited data that getting LDL under 70 can result in plaque regression; discussed plant-based diet, limiting saturated fats      Relevant Medications   aspirin EC 81 MG tablet   Other Relevant Orders   Ambulatory referral to Cardiology   Aortic atherosclerosis (HCC)    Goal LDL less than 70      Relevant Medications   aspirin EC 81  MG tablet   Other Relevant Orders   Ambulatory referral to Cardiology     Other   Pulmonary nodules/lesions, multiple    Yearly chest CT; reviewed findings with him; praised him for quitting smoking      Obesity (BMI 30-39.9) (Chronic)    Work on weight loss, encouragement given      Hyperlipidemia LDL goal <70    Statin recommended; he declined; goal LDL less than 70; limit saturated fats; discussed plant-based diet as means to lower  LDL      Relevant Medications   aspirin EC 81 MG tablet   Other Relevant Orders   Ambulatory referral to Cardiology       Follow up plan: No Follow-up on file.  An after-visit summary was printed and given to the patient at [place].  Please see the patient instructions which may contain other information and recommendations beyond what is mentioned above in the assessment and plan.  Meds ordered this encounter  Medications  . aspirin EC 81 MG tablet    Sig: Take 1 tablet (81 mg total) by mouth daily.    Orders Placed This Encounter  Procedures  . Ambulatory referral to Cardiology

## 2016-06-27 NOTE — Assessment & Plan Note (Signed)
Yearly chest CT; reviewed findings with him; praised him for quitting smoking

## 2016-08-16 ENCOUNTER — Ambulatory Visit (INDEPENDENT_AMBULATORY_CARE_PROVIDER_SITE_OTHER): Payer: Managed Care, Other (non HMO) | Admitting: Cardiology

## 2016-08-16 ENCOUNTER — Encounter: Payer: Self-pay | Admitting: Cardiology

## 2016-08-16 VITALS — BP 106/84 | HR 71 | Ht 71.0 in | Wt 228.0 lb

## 2016-08-16 DIAGNOSIS — Z8679 Personal history of other diseases of the circulatory system: Secondary | ICD-10-CM

## 2016-08-16 DIAGNOSIS — E669 Obesity, unspecified: Secondary | ICD-10-CM

## 2016-08-16 DIAGNOSIS — I2510 Atherosclerotic heart disease of native coronary artery without angina pectoris: Secondary | ICD-10-CM | POA: Diagnosis not present

## 2016-08-16 DIAGNOSIS — E785 Hyperlipidemia, unspecified: Secondary | ICD-10-CM

## 2016-08-16 DIAGNOSIS — Z8249 Family history of ischemic heart disease and other diseases of the circulatory system: Secondary | ICD-10-CM

## 2016-08-16 DIAGNOSIS — I700 Atherosclerosis of aorta: Secondary | ICD-10-CM | POA: Diagnosis not present

## 2016-08-16 NOTE — Progress Notes (Signed)
PCP: Arnetha Courser, MD  Clinic Note: Chief Complaint  Patient presents with  . New Patient (Initial Visit)    hardening of the arteries after recent test, here to est care, former patient (coronary artery and great vessel atherosclerosis/plaque)    HPI: MATISSE SALAIS is a 56 y.o. male who is being seen today for Reestablishing cardiology care at the request of Lada, Satira Anis, MD.  Lubertha Sayres was last seen in January 2015 in follow-up for syncope  Recent Hospitalizations: none  Studies Personally Reviewed - (if available, images/films reviewed: From Epic Chart or Care Everywhere)  Nuclear stress test January 2015: No evidence of ischemia or infarction.  Chest CT  March 2018: CV Findings: No pericardial fluid, thickening or pericardial calcification. Aortic and great vessel atherosclerosis in the mediastinum as well as in the coronary arteries including plaque noted in the RCA. Several small pulmonary nodules noted throughout. Largest is only 5.8 mm the periphery left lower lobe  Interval History: He presents today to discuss the results of his chest CT which showed coronary and great vessel calcification. This was done to evaluate a lung nodule. My last saw him we were discussing a syncopal episode which has not recurred. He actually is doing very well since I last saw him. He quit smoking, and has recently been working on his diet and exercise having dropped 7 pounds since January. He is not had any resting or exertional chest tightness/pressure or dyspnea.   Negative CV Review of Symptoms No PND, orthopnea or edema. No palpitations, lightheadedness, dizziness, weakness or syncope/near syncope. No TIA/amaurosis fugax symptoms. No claudication.  ROS: A comprehensive was performed. Review of Systems  Constitutional: Positive for weight loss (Intentional).  HENT: Negative for congestion and nosebleeds.   Respiratory: Negative for cough, shortness of breath and wheezing.     Cardiovascular:       Per history of present illness  Gastrointestinal: Negative for blood in stool, heartburn and melena.  Genitourinary: Negative for hematuria.  Musculoskeletal: Negative for falls and joint pain.  Neurological: Negative for dizziness and focal weakness.  Psychiatric/Behavioral: Negative for depression. The patient is not nervous/anxious.   All other systems reviewed and are negative.   I have reviewed and (if needed) personally updated the patient's problem list, medications, allergies, past medical and surgical history, social and family history.   Past Medical History:  Diagnosis Date  . Aortic atherosclerosis ([place]) 06/27/2016   Chest CT march 2018  . Asthma    as child  . Coronary artery disease 06/27/2016  . Family history of premature CAD 04/02/2013  . Hx of tobacco use, presenting hazards to health 05/31/2016   Quit Dec 2017  . Hyperlipidemia LDL goal <130 04/02/2013  . Hypertension, essential   . Impaired glucose tolerance in obese   . Kidney stone   . Metabolic syndrome    Obese; HLD (TG 243), Low HDL (34), HTN  . Obesity (BMI 30-39.9) 04/02/2013  . OSA (obstructive sleep apnea)    Not currently on CPAP; clinical diagnosis by PCP  . Pulmonary nodules/lesions, multiple 06/27/2016   Chest CT 2018  . Sleep apnea   . Smoking greater than 10 pack years 04/02/2013  . Syncope     Past Surgical History:  Procedure Laterality Date  . KNEE SURGERY     Extensive L Knee surgery  . Treadmill Myoview  03/2013   Exercise 10 min; 12 METS (excellent exercise tolerance)) - read 169 bpm (101% of max  predicted) - no ECG or scintigraphic evidence of ischemia or infarction    Current Meds  Medication Sig  . aspirin EC 81 MG tablet Take 1 tablet (81 mg total) by mouth daily.  . Aspirin-Salicylamide-Caffeine (BC HEADACHE POWDER PO) Take by mouth as needed.  Marland Kitchen ibuprofen (ADVIL,MOTRIN) 200 MG tablet Take 200 mg by mouth every 6 (six) hours as needed.    Allergies  Allergen  Reactions  . Shrimp [Shellfish Allergy]     Itching and rash    Social History   Social History  . Marital status: Married    Spouse name: N/A  . Number of children: 1  . Years of education: N/A   Occupational History  .      Self Employed - Full time; Tree [employer]   Social History Main Topics  . Smoking status: Former Smoker    Packs/day: 1.00    Years: 30.00    Types: Cigarettes    Quit date: 02/25/2016  . Smokeless tobacco: Former Systems developer    Quit date: 03/24/2013     Comment: Smoked off & on for ~20 yrs prior to the last ~10 yr spell  . Alcohol use 0.0 oz/week     Comment: ocassional; usually not bothered by 2-3 beers +/- mixed drinks  . Drug use: No  . Sexual activity: Yes    Partners: Female   Other Topics Concern  . None   Social History Narrative   Married Father of 1 daughter.    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  Prior to that, smoked off & on for ~20+ years.   Drinks occasional social alcohol - moderate; on weekends   Occasionally walks / does light exercise.          family history includes Bladder Cancer in his mother; Cancer in his mother; Esophageal cancer in his mother; Heart attack (age of onset: 47) in his father and mother; Hyperlipidemia in his father; Hypertension in his mother; Lung cancer in his father.  Wt Readings from Last 3 Encounters:  08/16/16 228 lb (103.4 kg)  06/27/16 237 lb 8 oz (107.7 kg)  05/31/16 232 lb 3.2 oz (105.3 kg)    PHYSICAL EXAM BP 106/84 (BP Location: Right Arm, Patient Position: Sitting, Cuff Size: Large)   Pulse 71   Ht 5\' 11"  (1.803 m)   Wt 228 lb (103.4 kg)   BMI 31.80 kg/m  General appearance: alert, cooperative, appears stated age, no distress. Mildly obese, but well-nourished and well-groomed. HEENT: [place]/AT, EOMI, MMM, anicteric sclera Neck: no adenopathy, no carotid bruit and no JVD Lungs: clear to auscultation bilaterally, normal percussion  bilaterally and non-labored Heart: regular rate and rhythm, S1 &S2 normal, no murmur, click, rub or gallop; nondisplaced PMI Abdomen: soft, non-tender; bowel sounds normal; no masses,  no organomegaly; no HJR Extremities: extremities normal, atraumatic, no cyanosis, and edema - none Pulses: 2+ and symmetric;  Skin: normal and mobility and turgor normal - no rashes or lesions Neurologic: Mental status: Alert & oriented x 3, thought content appropriate; non-focal exam.  Pleasant mood & affect. Cranial nerves: normal (II-XII grossly intact)    Adult ECG Report  Rate: 71 ;  Rhythm: normal sinus rhythm and Normal axis, intervals and durations.;   Narrative Interpretation: Normal EKG   Other studies Reviewed: Additional studies/ records that were reviewed today include:  Recent Labs:   Lab Results  Component Value Date  CHOL 199 05/31/2016   HDL 38 (L) 05/31/2016   LDLCALC 135 (H) 05/31/2016   TRIG 130 05/31/2016   CHOLHDL 5.2 (H) 05/31/2016     ASSESSMENT / PLAN: Problem List Items Addressed This Visit    Aortic atherosclerosis (HCC) (Chronic)    Aggressive risk factor modification of which most notable outlier is his lipid panel. Strongly consider statin      Relevant Orders   EKG 12-Lead (Completed)   CT CORONARY MORPH W/CTA COR W/SCORE W/CA W/CM &/OR WO/CM   CT CORONARY FRACTIONAL FLOW RESERVE DATA PREP   CT CORONARY FRACTIONAL FLOW RESERVE FLUID ANALYSIS   Coronary artery calcification seen on CAT scan - Primary (Chronic)    Cardiac calcification noted in a cinematic patient who does have risk factors including male, age greater than 103, obesity, hyperlipidemia  --> at this point, he is asymptomatic and I don't think a stress test is warranted.   I would like to quantify the extent of calcification of the coronary arteries and also any significant plaque especially in the RCA with a coronary CTA and calcium score. Once our new scanner is in place at the end of June, he can  have a coronary CTA with the ability to do CT FFR in order to evaluate for potential significant lesion. This collimation study will allow [place] to quantify his plaque burden as well as determine if is any significant lesion.  With his diagnosis of significant coronary and great vessel atherosclerosis, his goal LDL should now be less than 100 and preferably less than 70. Currently he is working on lifestyle management in order to try to improve his lipids. If not a goal and recheck 3-4 months after his March labs, I would strongly recommend statin.  He is taking an aspirin. His blood pressure is well-controlled and therefore does not need antihypertensives.      Relevant Orders   EKG 12-Lead (Completed)   CT CORONARY MORPH W/CTA COR W/SCORE W/CA W/CM &/OR WO/CM   CT CORONARY FRACTIONAL FLOW RESERVE DATA PREP   CT CORONARY FRACTIONAL FLOW RESERVE FLUID ANALYSIS   Family history of premature CAD (Chronic)    Strong family history of premature coronary disease in a patient now with noted atherosclerotic disease in his RCA. Plan is to evaluate to quantify and risk stratify via coronary CTA and calcium score. Aggressive lipid management. Blood pressures are controlled. He has quit smoking. Monitor for signs value suggesting diabetes with low threshold to initiate therapy.      Relevant Orders   EKG 12-Lead (Completed)   CT CORONARY MORPH W/CTA COR W/SCORE W/CA W/CM &/OR WO/CM   CT CORONARY FRACTIONAL FLOW RESERVE DATA PREP   CT CORONARY FRACTIONAL FLOW RESERVE FLUID ANALYSIS   History of hypertension    Currently controlled without any medications. Continue to monitor.      Hyperlipidemia with target low density lipoprotein (LDL) cholesterol less than 70 mg/dL (Chronic)    Based on the extent of coronary calcifications great vessel/atherosclerotic disease, target range should be 70-100, but would shoot for a goal less than 70. Apparently he is refused or declined statin therapy by his PCP. Trying  to do dietary modification. But he does not reach goal, then we need to initiate therapy. I explained this to him.      Obesity (BMI 30-39.9) (Chronic)    He is doing well with his weight loss, but still has room to go. Continue dietary modification as well as exercise  Relevant Orders   CT CORONARY MORPH W/CTA COR W/SCORE W/CA W/CM &/OR WO/CM   CT CORONARY FRACTIONAL FLOW RESERVE DATA PREP   CT CORONARY FRACTIONAL FLOW RESERVE FLUID ANALYSIS      Current medicines are reviewed at length with the patient today. (+/- concerns) None The following changes have been made: None  Patient Instructions  Schedule at [facility] [address] in July 2018 Your physician has requested that you have cardiac CT. Cardiac computed tomography (CT) is a painless test that uses an x-ray machine to take clear, detailed pictures of your heart. For further information please visit [email or web address]. Please follow instruction sheet as given.  No changes with current medications  Your physician recommends that you schedule a follow-up appointment in 2 months with Dr Ellyn Hack    Studies Ordered:   Orders Placed This Encounter  Procedures  . CT CORONARY MORPH W/CTA COR W/SCORE W/CA W/CM &/OR WO/CM  . CT CORONARY FRACTIONAL FLOW RESERVE DATA PREP  . CT CORONARY FRACTIONAL FLOW RESERVE FLUID ANALYSIS  . EKG 12-Lead      Glenetta Hew, M.D., M.S. Interventional Cardiologist   Pager # [phone] Phone # [phone] [address] [place], [place] [address]

## 2016-08-16 NOTE — Patient Instructions (Addendum)
Schedule at [facility]  [address] in July 2018 Your physician has requested that you have cardiac CT. Cardiac computed tomography (CT) is a painless test that uses an x-ray machine to take clear, detailed pictures of your heart. For further information please visit [email or web address]. Please follow instruction sheet as given.  No changes with current medications  Your physician recommends that you schedule a follow-up appointment in 2 months with Dr Ellyn Hack

## 2016-08-23 ENCOUNTER — Encounter: Payer: Self-pay | Admitting: Cardiology

## 2016-08-23 NOTE — Assessment & Plan Note (Signed)
Aggressive risk factor modification of which most notable outlier is his lipid panel. Strongly consider statin

## 2016-08-23 NOTE — Assessment & Plan Note (Signed)
Strong family history of premature coronary disease in a patient now with noted atherosclerotic disease in his RCA. Plan is to evaluate to quantify and risk stratify via coronary CTA and calcium score. Aggressive lipid management. Blood pressures are controlled. He has quit smoking. Monitor for signs value suggesting diabetes with low threshold to initiate therapy.

## 2016-08-23 NOTE — Assessment & Plan Note (Signed)
He is doing well with his weight loss, but still has room to go. Continue dietary modification as well as exercise

## 2016-08-23 NOTE — Assessment & Plan Note (Signed)
Currently controlled without any medications. Continue to monitor.

## 2016-08-23 NOTE — Assessment & Plan Note (Addendum)
Cardiac calcification noted in a cinematic patient who does have risk factors including male, age greater than 64, obesity, hyperlipidemia  --> at this point, he is asymptomatic and I don't think a stress test is warranted.   I would like to quantify the extent of calcification of the coronary arteries and also any significant plaque especially in the RCA with a coronary CTA and calcium score. Once our new scanner is in place at the end of June, he can have a coronary CTA with the ability to do CT FFR in order to evaluate for potential significant lesion. This collimation study will allow [place] to quantify his plaque burden as well as determine if is any significant lesion.  With his diagnosis of significant coronary and great vessel atherosclerosis, his goal LDL should now be less than 100 and preferably less than 70. Currently he is working on lifestyle management in order to try to improve his lipids. If not a goal and recheck 3-4 months after his March labs, I would strongly recommend statin.  He is taking an aspirin. His blood pressure is well-controlled and therefore does not need antihypertensives.

## 2016-08-23 NOTE — Assessment & Plan Note (Signed)
Based on the extent of coronary calcifications great vessel/atherosclerotic disease, target range should be 70-100, but would shoot for a goal less than 70. Apparently he is refused or declined statin therapy by his PCP. Trying to do dietary modification. But he does not reach goal, then we need to initiate therapy. I explained this to him.

## 2016-08-25 HISTORY — PX: OTHER SURGICAL HISTORY: SHX169

## 2016-09-28 ENCOUNTER — Telehealth: Payer: Self-pay | Admitting: *Deleted

## 2016-09-28 DIAGNOSIS — I700 Atherosclerosis of aorta: Secondary | ICD-10-CM

## 2016-09-28 DIAGNOSIS — Z8249 Family history of ischemic heart disease and other diseases of the circulatory system: Secondary | ICD-10-CM

## 2016-09-28 DIAGNOSIS — I2510 Atherosclerotic heart disease of native coronary artery without angina pectoris: Secondary | ICD-10-CM

## 2016-09-28 NOTE — Telephone Encounter (Signed)
Per--  Pre- cert  ---Cardiac CTA was denied . Per order , Dr Ellyn Hack is ordering  GXT  In it's place.   Spoke to  patient informed patient of the outcome  and schedule GXT. Patient aware and in agreement to continue.    Sent info to be scheduled.

## 2016-10-03 ENCOUNTER — Telehealth: Payer: Self-pay | Admitting: Cardiology

## 2016-10-03 NOTE — Telephone Encounter (Signed)
Reviewed GXT instructions and provided directions to our office w/pt who verbalized understanding and is agreeable w/plan.

## 2016-10-04 ENCOUNTER — Ambulatory Visit (INDEPENDENT_AMBULATORY_CARE_PROVIDER_SITE_OTHER): Payer: Managed Care, Other (non HMO)

## 2016-10-04 DIAGNOSIS — I700 Atherosclerosis of aorta: Secondary | ICD-10-CM

## 2016-10-04 DIAGNOSIS — I2510 Atherosclerotic heart disease of native coronary artery without angina pectoris: Secondary | ICD-10-CM | POA: Diagnosis not present

## 2016-10-04 DIAGNOSIS — Z8249 Family history of ischemic heart disease and other diseases of the circulatory system: Secondary | ICD-10-CM | POA: Diagnosis not present

## 2016-10-10 LAB — EXERCISE TOLERANCE TEST
CHL CUP RESTING HR STRESS: 71 {beats}/min
CSEPHR: 96 %
Estimated workload: 10.9 METS
Exercise duration (min): 9 min
Exercise duration (sec): 31 s
MPHR: 163 {beats}/min
Peak HR: 157 {beats}/min

## 2016-10-12 ENCOUNTER — Encounter: Payer: Self-pay | Admitting: Cardiology

## 2016-10-12 ENCOUNTER — Ambulatory Visit (INDEPENDENT_AMBULATORY_CARE_PROVIDER_SITE_OTHER): Payer: Managed Care, Other (non HMO) | Admitting: Cardiology

## 2016-10-12 VITALS — BP 145/92 | HR 67 | Ht 71.0 in | Wt 230.0 lb

## 2016-10-12 DIAGNOSIS — Z8249 Family history of ischemic heart disease and other diseases of the circulatory system: Secondary | ICD-10-CM

## 2016-10-12 DIAGNOSIS — I2510 Atherosclerotic heart disease of native coronary artery without angina pectoris: Secondary | ICD-10-CM | POA: Diagnosis not present

## 2016-10-12 DIAGNOSIS — E785 Hyperlipidemia, unspecified: Secondary | ICD-10-CM

## 2016-10-12 DIAGNOSIS — I700 Atherosclerosis of aorta: Secondary | ICD-10-CM

## 2016-10-12 DIAGNOSIS — R030 Elevated blood-pressure reading, without diagnosis of hypertension: Secondary | ICD-10-CM

## 2016-10-12 DIAGNOSIS — E669 Obesity, unspecified: Secondary | ICD-10-CM

## 2016-10-12 NOTE — Progress Notes (Signed)
PCP: Arnetha Courser, MD  Clinic Note: Chief Complaint  Patient presents with  . Follow-up    Coronary artery calcification; post GXT    HPI: Edward Bean is a 56 y.o. male who is being seen today for follow-up evaluation of coronary artery calcification noted on CT scan at the request of Lada, Satira Anis, MD.  Edward Bean was initially seen in consultation on 08/16/2016 for aortic atherosclerosis and calcification on CT scan. My initial intention was to check coronary CTA, however this was declined by his insurance company because of his "lack of symptoms ". We therefore converted to a GXT which is noted below   Recent Hospitalizations: n/a  Studies Personally Reviewed - (if available, images/films reviewed: From Epic Chart or Care Everywhere)  GXT June 2018: LOW RISK. The Duke treadmill score + 9.5  Interval History: Cyndie Chime continues to do relatively well. He does meet home his blood pressure is much better controlled than it is now. Use in the 417-4 30 mmHg systolic pressures. He tells me that he probably could've gone a minute to minute and half brother on the stress test, was told that he did need to because he reached target heart rate. He still doing quite well and had no chest tightness or pressure at level of exercise achieved. He has not had any real chest discomfort with rest or exertion. He may note some exertional dyspnea if he is really pushing himself, but not usually. He essentially doesn't really feel anything out of the ordinary. Cardiac review of symptoms as follows: No PND, orthopnea or edema.  No palpitations, lightheadedness, dizziness, weakness or syncope/near syncope. No TIA/amaurosis fugax symptoms. No claudication.  ROS: A comprehensive was performed. Review of Systems  Constitutional: Negative for malaise/fatigue.  HENT: Negative for congestion and nosebleeds.   Respiratory: Negative for shortness of breath and wheezing.   Cardiovascular:       Per  history of present illness  Gastrointestinal: Negative for blood in stool and melena.  Genitourinary: Negative for hematuria.  Musculoskeletal: Negative for falls, joint pain and myalgias.  Neurological: Negative for focal weakness.  All other systems reviewed and are negative.   I have reviewed and (if needed) personally updated the patient's problem list, medications, allergies, past medical and surgical history, social and family history.   Past Medical History:  Diagnosis Date  . Aortic atherosclerosis ([place]) 06/27/2016   Chest CT march 2018  . Asthma    as child  . Coronary artery disease 06/27/2016  . Family history of premature CAD 04/02/2013  . Hx of tobacco use, presenting hazards to health 05/31/2016   Quit Dec 2017  . Hyperlipidemia LDL goal <130 04/02/2013  . Hypertension, essential   . Impaired glucose tolerance in obese   . Kidney stone   . Metabolic syndrome    Obese; HLD (TG 243), Low HDL (34), HTN  . Obesity (BMI 30-39.9) 04/02/2013  . OSA (obstructive sleep apnea)    Not currently on CPAP; clinical diagnosis by PCP  . Pulmonary nodules/lesions, multiple 06/27/2016   Chest CT 2018  . Sleep apnea   . Smoking greater than 10 pack years 04/02/2013  . Syncope     Past Surgical History:  Procedure Laterality Date  . GXT/ETT  08/2016   Normal LV size. Moderate LVH. EF 60-65%. GR 1 DD. Indeterminate LV filling pressures (less than last echo). Mild aortic stenosis (peak gradient 24 mmHg. MAC. Mild biatrial enlargement.    Marland Kitchen KNEE SURGERY  Extensive L Knee surgery  . Treadmill Myoview  03/2013   Exercise 10 min; 12 METS (excellent exercise tolerance)) - read 169 bpm (101% of max predicted) - no ECG or scintigraphic evidence of ischemia or infarction    Current Meds  Medication Sig  . aspirin EC 81 MG tablet Take 1 tablet (81 mg total) by mouth daily.  . Aspirin-Salicylamide-Caffeine (BC HEADACHE POWDER PO) Take by mouth as needed.    Allergies  Allergen Reactions  .  Shrimp [Shellfish Allergy]     Itching and rash    Social History   Social History  . Marital status: Married    Spouse name: N/A  . Number of children: 1  . Years of education: N/A   Occupational History  .      Self Employed - Full time; Tree Service   Social History Main Topics  . Smoking status: Former Smoker    Packs/day: 1.00    Years: 30.00    Types: Cigarettes    Quit date: 02/25/2016  . Smokeless tobacco: Former Systems developer    Quit date: 03/24/2013     Comment: Smoked off & on for ~20 yrs prior to the last ~10 yr spell  . Alcohol use 0.0 oz/week     Comment: ocassional; usually not bothered by 2-3 beers +/- mixed drinks  . Drug use: No  . Sexual activity: Yes    Partners: Female   Other Topics Concern  . None   Social History Narrative   Married Father of 1 daughter.    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  Prior to that, smoked off & on for ~20+ years.   Drinks occasional social alcohol - moderate; on weekends   Occasionally walks / does light exercise.          family history includes Bladder Cancer in his mother; Cancer in his mother; Esophageal cancer in his mother; Heart attack (age of onset: 68) in his father and mother; Hyperlipidemia in his father; Hypertension in his mother; Lung cancer in his father.  Wt Readings from Last 3 Encounters:  10/12/16 230 lb (104.3 kg)  08/16/16 228 lb (103.4 kg)  06/27/16 237 lb 8 oz (107.7 kg)    PHYSICAL EXAM BP (!) 145/92   Pulse 67   Ht _0  (1.803 m)   Wt 230 lb (104.3 kg)   SpO2 100%   BMI 32.08 kg/m  General appearance: alert, cooperative, appears stated age, no distress. Mildly  Obese. Otherwise well-nourished  HEENT: [place]/AT, EOMI, MMM, anicteric sclera Neck: no adenopathy, no carotid bruit and no JVD Lungs: clear to auscultation bilaterally, normal percussion bilaterally and non-labored Heart: regular rate and rhythm, S1 &S2 normal, no  murmur, click, rub or gallop; non-displaced PMI Abdomen: soft, non-tender; bowel sounds normal; no masses,  no organomegaly; no HJR Extremities: extremities normal, atraumatic, no cyanosis, or edema Neurologic: Mental status: Alert & oriented x 3, thought content appropriate; non-focal exam.  Pleasant mood & affect.    Adult ECG Report n/a  Other studies Reviewed: Additional studies/ records that were reviewed today include:  Recent Labs:   Lab Results  Component Value Date   CHOL 199 05/31/2016   HDL 38 (L) 05/31/2016   LDLCALC 135 (H) 05/31/2016   TRIG 130 05/31/2016   CHOLHDL 5.2 (H) 05/31/2016   Lab Results  Component Value Date   CREATININE 1.12 05/31/2016   BUN 18 05/31/2016  NA 139 05/31/2016   K 4.7 05/31/2016   CL 105 05/31/2016   CO2 28 05/31/2016    ASSESSMENT / PLAN: Problem List Items Addressed This Visit    Aortic atherosclerosis (HCC) (Chronic)    Aggressive risk factor modification. - Strongly consider initiate statin, will need to reassess lipids while on exercise and diet regimen.      Coronary artery calcification seen on CAT scan (Chronic)    Coronary calcification would clearly indicate that he does have coronary disease, however the fact he did very well as GXT would suggest that is nonocclusive. Continue aggressive risk factor modification - blood pressure, lipid and glycemic control.; Diet and exercise.  Continue daily baby aspirin  Follow-up evaluation in one to 2 years.        Elevated blood pressure reading (Chronic)    He has had some high blood pressures during evaluations, but usually at home they seemed to be well controlled. I suspect he may have some anxiety coming to the doctor's office. Need to continue to follow closely. Target blood pressure ranged between 932-671 mmHg systolic.      Family history of premature CAD (Chronic)    Aggressive risk factor modification, in a year, I will do a coronary calcium score to quantify the  extent of calcification which could then potentially warrant further evaluation coronary CTA.      Hyperlipidemia with target low density lipoprotein (LDL) cholesterol less than 70 mg/dL (Chronic)    Target range for LDL should be 70-100 based on known CAD. Previously had declined statin therapy with his PCP is attempting to try lifestyle modification. We'll need close reevaluation of lipids. If he does not meet target, I would strongly consider initiating statin (try something like Crestor first is he would be more likely to tolerate)         Obesity (BMI 30-39.9) (Chronic)    The patient understands the need to lose weight with diet and exercise. We have discussed specific strategies for this.         Current medicines are reviewed at length with the patient today. (+/- concerns) n/a The following changes have been made: n/a  Patient Instructions  Medication Instructions: No changes   Follow-Up: Your physician wants you to follow-up in: 12 months with Dr. Ellyn Hack. You will receive a reminder letter in the mail two months in advance. If you don't receive a letter, please call our office to schedule the follow-up appointment.    If you need a refill on your cardiac medications before your next appointment, please call your pharmacy.     Studies Ordered:   No orders of the defined types were placed in this encounter.     Glenetta Hew, M.D., M.S. Interventional Cardiologist   Pager # [phone] Phone # [phone] [address] [place], [place] [address]

## 2016-10-12 NOTE — Patient Instructions (Signed)
Medication Instructions: No changes   Follow-Up: Your physician wants you to follow-up in: 12 months with Dr. Ellyn Hack. You will receive a reminder letter in the mail two months in advance. If you don't receive a letter, please call our office to schedule the follow-up appointment.    If you need a refill on your cardiac medications before your next appointment, please call your pharmacy.

## 2016-10-14 ENCOUNTER — Encounter: Payer: Self-pay | Admitting: Cardiology

## 2016-10-14 DIAGNOSIS — R030 Elevated blood-pressure reading, without diagnosis of hypertension: Secondary | ICD-10-CM | POA: Insufficient documentation

## 2016-10-14 NOTE — Assessment & Plan Note (Signed)
Aggressive risk factor modification, in a year, I will do a coronary calcium score to quantify the extent of calcification which could then potentially warrant further evaluation coronary CTA.

## 2016-10-14 NOTE — Assessment & Plan Note (Addendum)
He has had some high blood pressures during evaluations, but usually at home they seemed to be well controlled. I suspect he may have some anxiety coming to the doctor's office. Need to continue to follow closely. Target blood pressure ranged between 811-886 mmHg systolic.

## 2016-10-14 NOTE — Assessment & Plan Note (Signed)
The patient understands the need to lose weight with diet and exercise. We have discussed specific strategies for this.  

## 2016-10-14 NOTE — Assessment & Plan Note (Signed)
Target range for LDL should be 70-100 based on known CAD. Previously had declined statin therapy with his PCP is attempting to try lifestyle modification. We'll need close reevaluation of lipids. If he does not meet target, I would strongly consider initiating statin (try something like Crestor first is he would be more likely to tolerate)

## 2016-10-14 NOTE — Assessment & Plan Note (Addendum)
Coronary calcification would clearly indicate that he does have coronary disease, however the fact he did very well as GXT would suggest that is nonocclusive. Continue aggressive risk factor modification - blood pressure, lipid and glycemic control.; Diet and exercise.  Continue daily baby aspirin  Follow-up evaluation in one to 2 years.

## 2016-10-14 NOTE — Assessment & Plan Note (Signed)
Aggressive risk factor modification. - Strongly consider initiate statin, will need to reassess lipids while on exercise and diet regimen.

## 2016-10-23 ENCOUNTER — Encounter: Payer: Self-pay | Admitting: Family Medicine

## 2016-10-23 ENCOUNTER — Ambulatory Visit (INDEPENDENT_AMBULATORY_CARE_PROVIDER_SITE_OTHER): Payer: Managed Care, Other (non HMO) | Admitting: Family Medicine

## 2016-10-23 DIAGNOSIS — I700 Atherosclerosis of aorta: Secondary | ICD-10-CM | POA: Diagnosis not present

## 2016-10-23 DIAGNOSIS — I2510 Atherosclerotic heart disease of native coronary artery without angina pectoris: Secondary | ICD-10-CM | POA: Diagnosis not present

## 2016-10-23 DIAGNOSIS — Z87891 Personal history of nicotine dependence: Secondary | ICD-10-CM

## 2016-10-23 DIAGNOSIS — E785 Hyperlipidemia, unspecified: Secondary | ICD-10-CM | POA: Diagnosis not present

## 2016-10-23 DIAGNOSIS — N200 Calculus of kidney: Secondary | ICD-10-CM | POA: Diagnosis not present

## 2016-10-23 DIAGNOSIS — R7303 Prediabetes: Secondary | ICD-10-CM | POA: Diagnosis not present

## 2016-10-23 DIAGNOSIS — E669 Obesity, unspecified: Secondary | ICD-10-CM

## 2016-10-23 LAB — BASIC METABOLIC PANEL
BUN: 14 mg/dL (ref 7–25)
CO2: 21 mmol/L (ref 20–31)
CREATININE: 0.93 mg/dL (ref 0.70–1.33)
Calcium: 9 mg/dL (ref 8.6–10.3)
Chloride: 105 mmol/L (ref 98–110)
Glucose, Bld: 104 mg/dL — ABNORMAL HIGH (ref 65–99)
POTASSIUM: 4 mmol/L (ref 3.5–5.3)
Sodium: 139 mmol/L (ref 135–146)

## 2016-10-23 LAB — LIPID PANEL
Cholesterol: 190 mg/dL (ref <200)
HDL: 37 mg/dL — AB (ref >40)
LDL Cholesterol: 124 mg/dL — ABNORMAL HIGH (ref <100)
Total CHOL/HDL Ratio: 5.1 Ratio — ABNORMAL HIGH (ref <5.0)
Triglycerides: 146 mg/dL (ref <150)
VLDL: 29 mg/dL (ref <30)

## 2016-10-23 NOTE — Assessment & Plan Note (Signed)
Discussed need for weight loss; BMI numbers calculated to get him out of obesity category; he does not think he can get to BMI of 27

## 2016-10-23 NOTE — Assessment & Plan Note (Signed)
Check A1c today; work on healthy diet and exercise and weight loss

## 2016-10-23 NOTE — Progress Notes (Signed)
BP 128/78   Pulse 71   Temp 98.3 F (36.8 C) (Oral)   Resp 16   Ht _0  (1.778 m)   Wt 233 lb 8 oz (105.9 kg)   SpO2 97%   BMI 33.50 kg/m    Subjective:    Patient ID: Edward Bean, male    DOB: 1961/01/05, 56 y.o.   MRN: [id number]  HPI: Edward Bean is a 56 y.o. male  Chief Complaint  Patient presents with  . Follow-up    Fasting labs   HPI Patient is here for follow-up; since last visit, he has seen the cardiologist He was trying to do a calcium CT score because patient was asymptomatic Did a general stress test and did okay on that, "did great" per patient No chest pain  High blood pressure; has been better at home; 130/90  High cholesterol; cardiologist talked to him about LDL goals; on statin  Prediabetes; has gained 5 pounds; going to try to lose weight; cut out soft drinks for a long time, but now regular at night Really cut on sweet tea, does not make it at home  Past smoker, not planning to restart  Obesity; gained some weight over Fourth of July; ate what he wanted to; he asked what a good weight would be  Hx of kidney stones; managed by urologist, quit going to her, Zara Council; 7 stones in one kidney and 5 in the other; no one in the family w/kidney stones  Depression screen [facility] 2/9 10/23/2016 06/27/2016 05/31/2016 05/05/2015 02/10/2015  Decreased Interest 0 0 0 0 0  Down, Depressed, Hopeless 0 0 0 0 0  PHQ - 2 Score 0 0 0 0 0   Relevant past medical, surgical, family and social history reviewed Past Medical History:  Diagnosis Date  . Aortic atherosclerosis ([place]) 06/27/2016   Chest CT march 2018  . Asthma    as child  . Coronary artery disease 06/27/2016  . Family history of premature CAD 04/02/2013  . Hx of tobacco use, presenting hazards to health 05/31/2016   Quit Dec 2017  . Hyperlipidemia LDL goal <130 04/02/2013  . Hypertension, essential   . Impaired glucose tolerance in obese   . Kidney stone   . Metabolic syndrome    Obese; HLD (TG  243), Low HDL (34), HTN  . Obesity (BMI 30-39.9) 04/02/2013  . OSA (obstructive sleep apnea)    Not currently on CPAP; clinical diagnosis by PCP  . Pulmonary nodules/lesions, multiple 06/27/2016   Chest CT 2018  . Sleep apnea   . Smoking greater than 10 pack years 04/02/2013  . Syncope    Past Surgical History:  Procedure Laterality Date  . GXT/ETT  08/2016   Normal LV size. Moderate LVH. EF 60-65%. GR 1 DD. Indeterminate LV filling pressures (less than last echo). Mild aortic stenosis (peak gradient 24 mmHg. MAC. Mild biatrial enlargement.    Marland Kitchen KNEE SURGERY     Extensive L Knee surgery  . Treadmill Myoview  03/2013   Exercise 10 min; 12 METS (excellent exercise tolerance)) - read 169 bpm (101% of max predicted) - no ECG or scintigraphic evidence of ischemia or infarction   Family History  Problem Relation Age of Onset  . Esophageal cancer Mother   . Heart attack Mother 54  . Hypertension Mother   . Bladder Cancer Mother   . Cancer Mother   . Lung cancer Father        Long term smoker  .  Heart attack Father 71       MI -> ~12 yrs later --> CABG  . Hyperlipidemia Father   . Kidney disease Neg Hx   . Prostate cancer Neg Hx    Social History   Social History  . Marital status: Married    Spouse name: N/A  . Number of children: 1  . Years of education: N/A   Occupational History  .      Self Employed - Full time; Tree [employer]   Social History Main Topics  . Smoking status: Former Smoker    Packs/day: 1.00    Years: 30.00    Types: Cigarettes    Quit date: 02/25/2016  . Smokeless tobacco: Former Systems developer    Quit date: 03/24/2013     Comment: Smoked off & on for ~20 yrs prior to the last ~10 yr spell  . Alcohol use 0.0 oz/week     Comment: ocassional; usually not bothered by 2-3 beers +/- mixed drinks  . Drug use: No  . Sexual activity: Yes    Partners: Female   Other Topics Concern  . Not on file   Social History Narrative   Married Father of 1 daughter.    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  Prior to that, smoked off & on for ~20+ years.   Drinks occasional social alcohol - moderate; on weekends   Occasionally walks / does light exercise.         Interim medical history since last visit reviewed. Allergies and medications reviewed  Review of Systems Per HPI unless specifically indicated above     Objective:    BP 128/78   Pulse 71   Temp 98.3 F (36.8 C) (Oral)   Resp 16   Ht _0  (1.778 m)   Wt 233 lb 8 oz (105.9 kg)   SpO2 97%   BMI 33.50 kg/m   Wt Readings from Last 3 Encounters:  10/23/16 233 lb 8 oz (105.9 kg)  10/12/16 230 lb (104.3 kg)  08/16/16 228 lb (103.4 kg)    Physical Exam  Constitutional: He appears well-developed and well-nourished. No distress.  obese  HENT:  Head: Normocephalic and atraumatic.  Eyes: EOM are normal. No scleral icterus.  Neck: Carotid bruit is not present. No thyromegaly present.  Cardiovascular: Normal rate and regular rhythm.   Pulmonary/Chest: Effort normal and breath sounds normal.  Abdominal: He exhibits no distension.  Neurological: Coordination normal.  Skin: Skin is warm and dry. No pallor.  Psychiatric: He has a normal mood and affect. His behavior is normal.    Results for orders placed or performed in visit on 10/04/16  EXERCISE TOLERANCE TEST  Result Value Ref Range   Rest HR 71 bpm   Rest BP 150/101 mmHg   Exercise duration (sec) 31 sec   Percent HR 96 %   Exercise duration (min) 9 min   Estimated workload 10.9 METS   Peak HR 157 bpm   Peak BP 197/91 mmHg   MPHR 163 bpm      Assessment & Plan:   Problem List Items Addressed This Visit      Cardiovascular and Mediastinum   Coronary artery calcification seen on CAT scan (Chronic)    Negative stress test with heart doctor and taking 81 mg daily aspirin; has seen cardiologist      Aortic atherosclerosis ([facility]) (Chronic)    Goal LDL less  than 70;  showed model of aorta in cross-section        Genitourinary   Kidney stones    Suggested plenty of hydration, add a little lemon to water, limit salt        Other   Pre-diabetes    Check A1c today; work on healthy diet and exercise and weight loss      Relevant Orders   Basic metabolic panel   Hemoglobin A1c   Personal history of tobacco use, presenting hazards to health    Does not plan on starting back      Obesity (BMI 30-39.9) (Chronic)    Discussed need for weight loss; BMI numbers calculated to get him out of obesity category; he does not think he can get to BMI of 27      Hyperlipidemia with target low density lipoprotein (LDL) cholesterol less than 70 mg/dL (Chronic)    Check fasting lipids today; showed model of atherosclerotic plaque; urged diet low in saturated fats      Relevant Orders   Lipid panel       Follow up plan: Return in about 4 months (around 02/23/2017) for follow-up visit with Dr. Sanda Klein.  An after-visit summary was printed and given to the patient at [facility].  Please see the patient instructions which may contain other information and recommendations beyond what is mentioned above in the assessment and plan.  No orders of the defined types were placed in this encounter.   Orders Placed This Encounter  Procedures  . Basic metabolic panel  . Hemoglobin A1c  . Lipid panel

## 2016-10-23 NOTE — Assessment & Plan Note (Signed)
Goal LDL less than 70; showed model of aorta in cross-section

## 2016-10-23 NOTE — Assessment & Plan Note (Addendum)
Negative stress test with heart doctor and taking 81 mg daily aspirin; has seen cardiologist

## 2016-10-23 NOTE — Patient Instructions (Addendum)
A weight of 208 would get your body mass index under 30, which is our first goal Try to limit saturated fats in your diet (bologna, hot dogs, barbeque, cheeseburgers, hamburgers, steak, bacon, sausage, cheese, etc.) and get more fresh fruits, vegetables, and whole grains Let's get labs today Check out the information at familydoctor.org entitled "Nutrition for Weight Loss: What You Need to Know about Fad Diets" Try to lose between 1-2 pounds per week by taking in fewer calories and burning off more calories You can succeed by limiting portions, limiting foods dense in calories and fat, becoming more active, and drinking 8 glasses of water a day (64 ounces) Don't skip meals, especially breakfast, as skipping meals may alter your metabolism Do not use over-the-counter weight loss pills or gimmicks that claim rapid weight loss A healthy BMI (or body mass index) is between 18.5 and 24.9 You can calculate your ideal BMI at the [place] website [employer]fr Hydrate well, and limit salt Try to follow the DASH guidelines (DASH stands for Dietary Approaches to Stop Hypertension) Try to limit the sodium in your diet.  Ideally, consume less than 1.5 grams (less than 1,500mg ) per day. Do not add salt when cooking or at the table.  Check the sodium amount on labels when shopping, and choose items lower in sodium when given a choice. Avoid or limit foods that already contain a lot of sodium. Eat a diet rich in fruits and vegetables and whole grains.

## 2016-10-23 NOTE — Assessment & Plan Note (Signed)
Does not plan on starting back

## 2016-10-23 NOTE — Assessment & Plan Note (Addendum)
Suggested plenty of hydration, add a little lemon to water, limit salt

## 2016-10-23 NOTE — Assessment & Plan Note (Signed)
Check fasting lipids today; showed model of atherosclerotic plaque; urged diet low in saturated fats

## 2016-10-24 LAB — HEMOGLOBIN A1C
HEMOGLOBIN A1C: 5.6 % (ref <5.7)
MEAN PLASMA GLUCOSE: 114 mg/dL

## 2016-10-31 ENCOUNTER — Other Ambulatory Visit: Payer: Self-pay | Admitting: Family Medicine

## 2016-10-31 DIAGNOSIS — I2510 Atherosclerotic heart disease of native coronary artery without angina pectoris: Secondary | ICD-10-CM

## 2016-10-31 DIAGNOSIS — Z5181 Encounter for therapeutic drug level monitoring: Secondary | ICD-10-CM | POA: Insufficient documentation

## 2016-10-31 DIAGNOSIS — I700 Atherosclerosis of aorta: Secondary | ICD-10-CM

## 2016-10-31 DIAGNOSIS — E785 Hyperlipidemia, unspecified: Secondary | ICD-10-CM

## 2016-10-31 MED ORDER — ATORVASTATIN CALCIUM 40 MG PO TABS: 40.0000 mg | ORAL_TABLET | Freq: Every day | ORAL | 1 refills | Status: DC

## 2016-10-31 NOTE — Progress Notes (Signed)
Start statin, recheck Sept 25th or so

## 2017-02-13 IMAGING — CR DG ABDOMEN 1V
1 series · 2 of 2 positions shown · non-contrast
Comparison: 02/10/2016

CLINICAL DATA: Bilateral renal calculi

EXAM:
ABDOMEN - 1 VIEW

[Series 1: dg abd 1 view · 0.14mm/px · 2 of 2 slices shown]
[im 1/2]
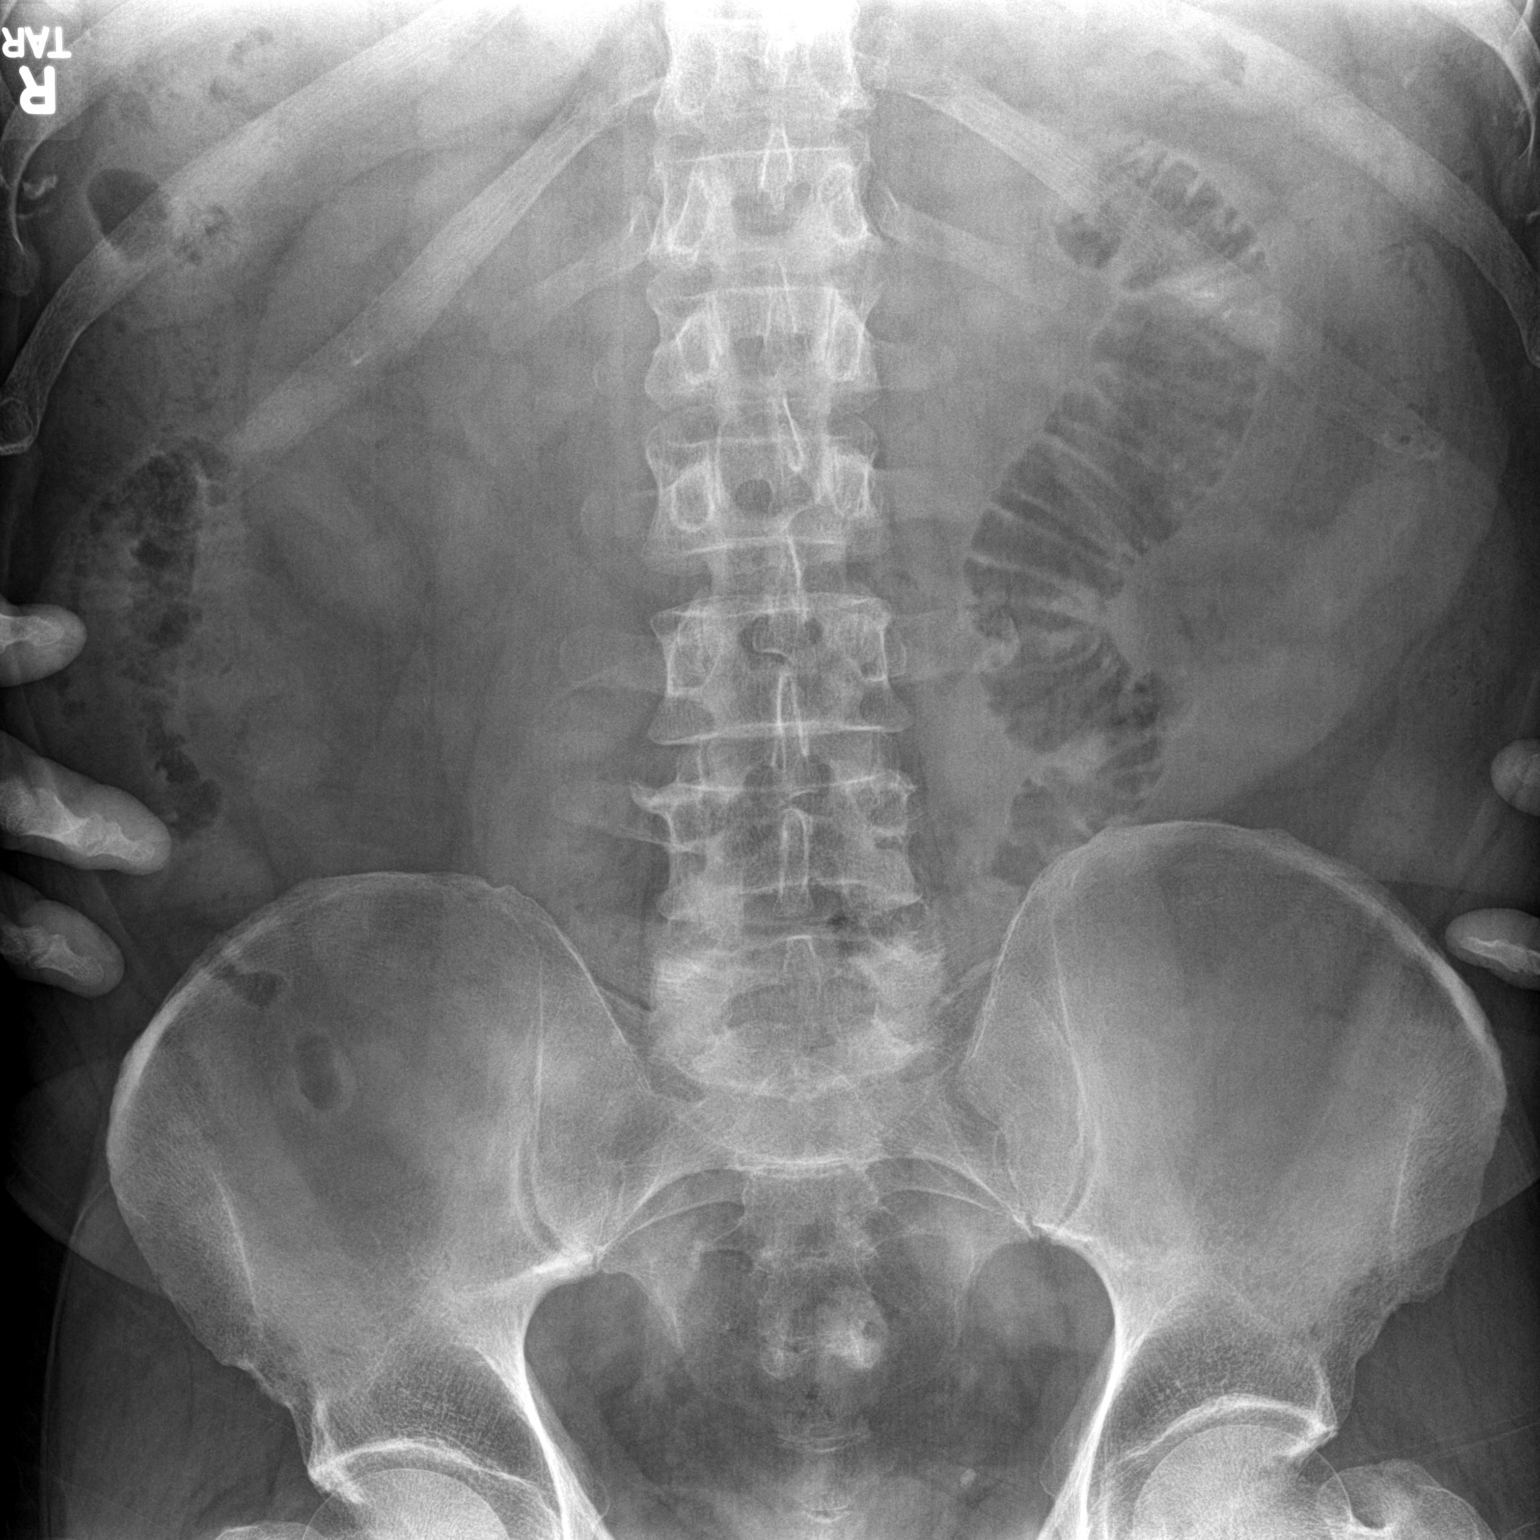
[im 2/2]
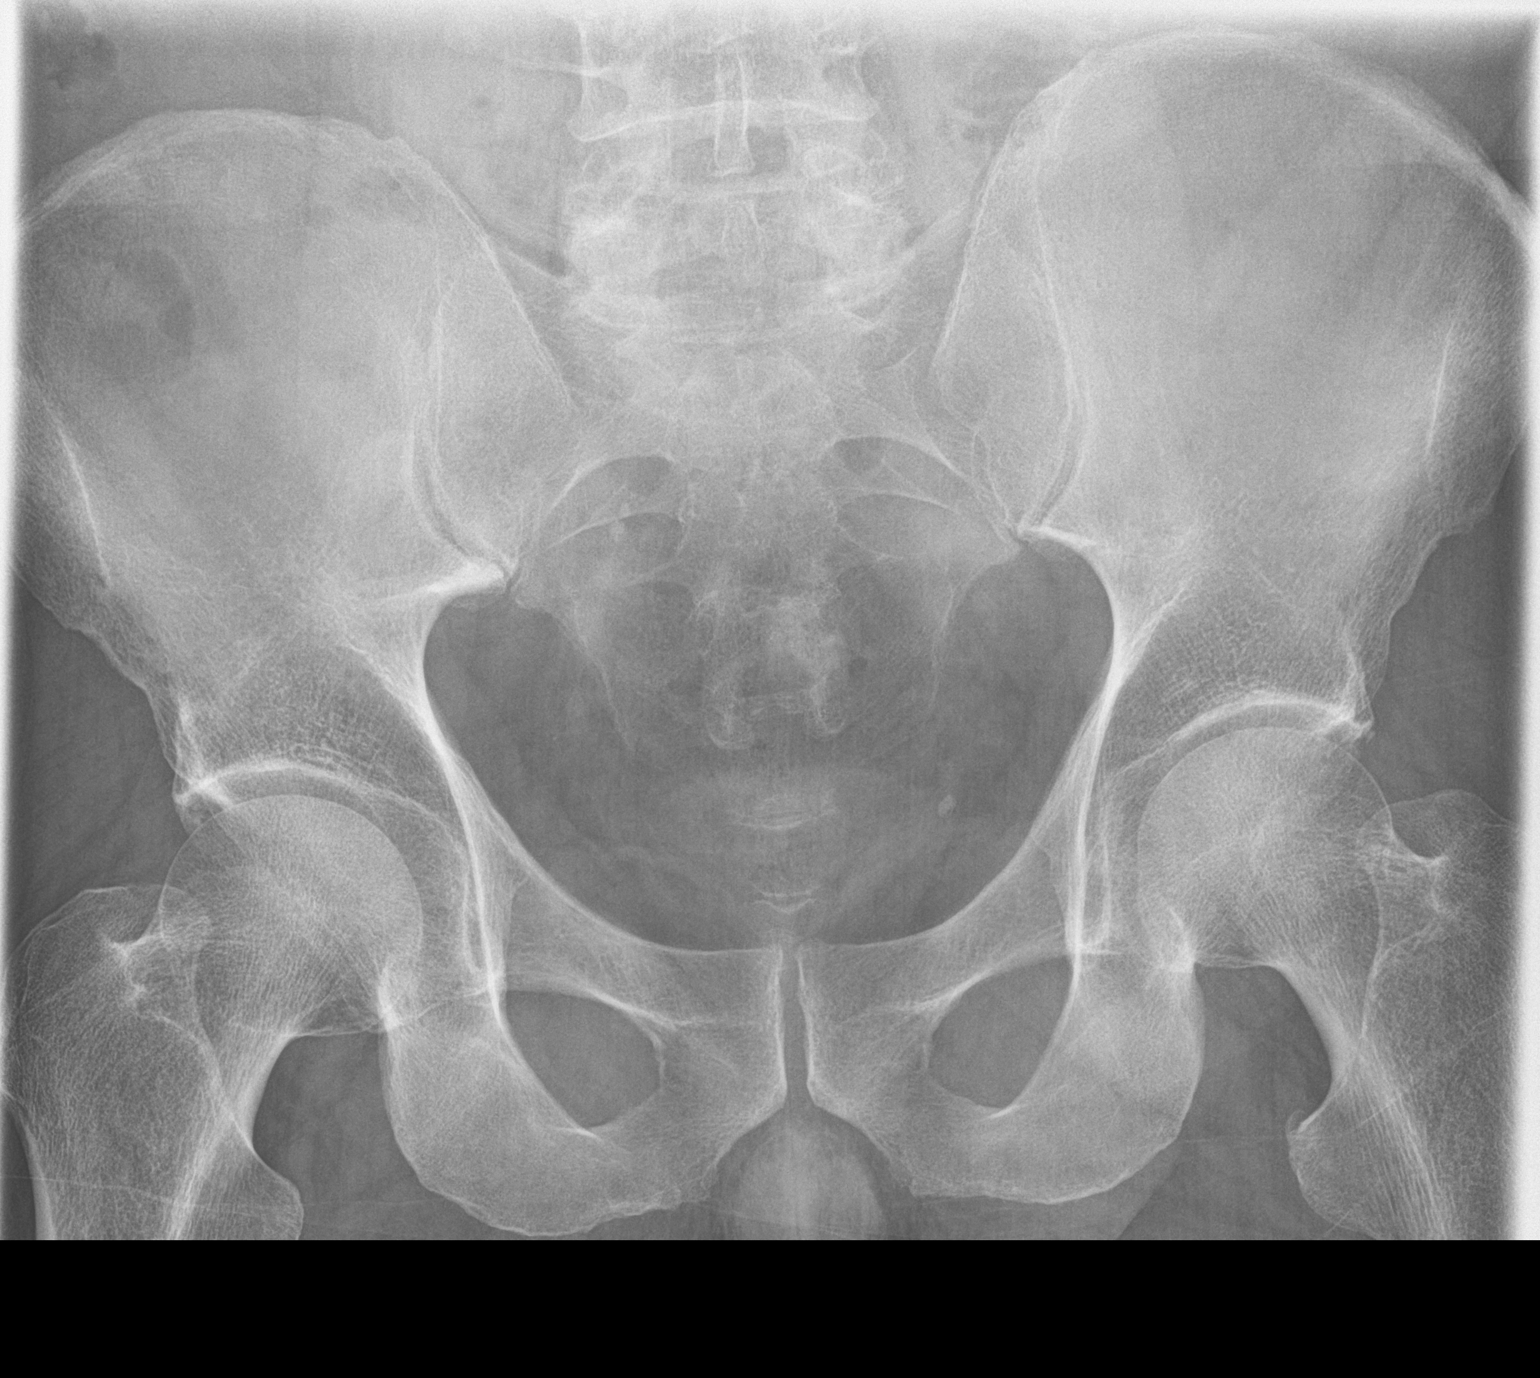

[2 of 2 positions shown; findings below may reference images not displayed]

FINDINGS: Scattered large and small bowel gas is noted. Some linear
calcifications are noted over the midportion of the right kidney
corresponding to the nonobstructing stones seen previously. No
ureteral calculi are seen. Similar changes are noted over the left
kidney also stable from the prior exam. A phlebolith is noted in the
left pelvis. No bony abnormality is seen.
IMPRESSION: Phlebolith noted within the left pelvis.

## 2017-02-23 ENCOUNTER — Encounter: Payer: Self-pay | Admitting: Family Medicine

## 2017-02-23 ENCOUNTER — Ambulatory Visit: Payer: Managed Care, Other (non HMO) | Admitting: Family Medicine

## 2017-02-23 DIAGNOSIS — B001 Herpesviral vesicular dermatitis: Secondary | ICD-10-CM | POA: Diagnosis not present

## 2017-02-23 DIAGNOSIS — I700 Atherosclerosis of aorta: Secondary | ICD-10-CM

## 2017-02-23 DIAGNOSIS — R7303 Prediabetes: Secondary | ICD-10-CM

## 2017-02-23 DIAGNOSIS — Z8679 Personal history of other diseases of the circulatory system: Secondary | ICD-10-CM

## 2017-02-23 DIAGNOSIS — I2510 Atherosclerotic heart disease of native coronary artery without angina pectoris: Secondary | ICD-10-CM

## 2017-02-23 DIAGNOSIS — E785 Hyperlipidemia, unspecified: Secondary | ICD-10-CM

## 2017-02-23 DIAGNOSIS — E669 Obesity, unspecified: Secondary | ICD-10-CM

## 2017-02-23 MED ORDER — ACYCLOVIR 5 % EX OINT: 1.0000 "application " | TOPICAL_OINTMENT | CUTANEOUS | 3 refills | Status: DC

## 2017-02-23 NOTE — Assessment & Plan Note (Signed)
Discussed atherosclerosis, role of statins, dietary influences; we ran through his dietary intake, modifications he could make; check fasting lipids on another day soon at his request and then he'll decide about taking the statin

## 2017-02-23 NOTE — Assessment & Plan Note (Signed)
Last A1c was actually 5.6 in July; encouraged weight loss; if he could get his weight down to BMI of less than 30, that would help, and if he could BMI under 25, that would be ideal

## 2017-02-23 NOTE — Progress Notes (Signed)
BP 122/80   Pulse 74   Temp 98.2 F (36.8 C) (Oral)   Resp 16   Wt 229 lb 6.4 oz (104.1 kg)   SpO2 96%   BMI 32.92 kg/m    Subjective:    Patient ID: Edward Bean, male    DOB: Nov 17, 1960, 56 y.o.   MRN: [id number]  HPI: Edward Bean is a 56 y.o. male  Chief Complaint  Patient presents with  . Follow-up  . Hyperlipidemia    pt never started chol med wants to discuss    HPI Patient is here for f/u He never started the cholesterol medicine and wants to talk about that; he did not get a chance to talk to any one He wants to know how it works He has really changed his diet; he has really cut out a lot of red meat, bacon and sausage; still eats some ham Not as much as cheese; more chicken than ground beef  He has prediabetes; tries to light bread; loves potatoes, has pasta occasionally Lab Results  Component Value Date   HGBA1C 5.6 10/23/2016   He is obese, but has lost 4 pounds since his last visit  He has CAD and aortic atherosclerosis; seeing Dr. Ellyn Hack; they had discussed doing calcium scoring test but insurance wasn't going to cover it apparently; he is taking aspirin daily  His blood pressure is controlled today  He gets fever blisters, usually corners of the mouth; he used to use topical; just a couple of times a year  Depression screen [facility] 2/9 02/23/2017 10/23/2016 06/27/2016 05/31/2016 05/05/2015  Decreased Interest 0 0 0 0 0  Down, Depressed, Hopeless 0 0 0 0 0  PHQ - 2 Score 0 0 0 0 0    Relevant past medical, surgical, family and social history reviewed Past Medical History:  Diagnosis Date  . Aortic atherosclerosis ([place]) 06/27/2016   Chest CT march 2018  . Asthma    as child  . Coronary artery disease 06/27/2016  . Family history of premature CAD 04/02/2013  . Hx of tobacco use, presenting hazards to health 05/31/2016   Quit Dec 2017  . Hyperlipidemia LDL goal <130 04/02/2013  . Hypertension, essential   . Impaired glucose tolerance in obese   . Kidney  stone   . Metabolic syndrome    Obese; HLD (TG 243), Low HDL (34), HTN  . Obesity (BMI 30-39.9) 04/02/2013  . OSA (obstructive sleep apnea)    Not currently on CPAP; clinical diagnosis by PCP  . Pulmonary nodules/lesions, multiple 06/27/2016   Chest CT 2018  . Sleep apnea   . Smoking greater than 10 pack years 04/02/2013  . Syncope    Past Surgical History:  Procedure Laterality Date  . GXT/ETT  08/2016   Normal LV size. Moderate LVH. EF 60-65%. GR 1 DD. Indeterminate LV filling pressures (less than last echo). Mild aortic stenosis (peak gradient 24 mmHg. MAC. Mild biatrial enlargement.    Marland Kitchen KNEE SURGERY     Extensive L Knee surgery  . Treadmill Myoview  03/2013   Exercise 10 min; 12 METS (excellent exercise tolerance)) - read 169 bpm (101% of max predicted) - no ECG or scintigraphic evidence of ischemia or infarction   Family History  Problem Relation Age of Onset  . Esophageal cancer Mother   . Heart attack Mother 54  . Hypertension Mother   . Bladder Cancer Mother   . Cancer Mother   . Lung cancer Father  Long term smoker  . Heart attack Father 64       MI -> ~12 yrs later --> CABG  . Hyperlipidemia Father   . Kidney disease Neg Hx   . Prostate cancer Neg Hx    Social History   Tobacco Use  . Smoking status: Former Smoker    Packs/day: 1.00    Years: 30.00    Pack years: 30.00    Types: Cigarettes    Last attempt to quit: 02/25/2016    Years since quitting: 0.9  . Smokeless tobacco: Former Systems developer    Quit date: 03/24/2013  . Tobacco comment: Smoked off & on for ~20 yrs prior to the last ~10 yr spell  Substance Use Topics  . Alcohol use: Yes    Alcohol/week: 0.0 oz    Comment: ocassional; usually not bothered by 2-3 beers +/- mixed drinks  . Drug use: No    Interim medical history since last visit reviewed. Allergies and medications reviewed  Review of Systems Per HPI unless specifically indicated above     Objective:    BP 122/80   Pulse 74   Temp  98.2 F (36.8 C) (Oral)   Resp 16   Wt 229 lb 6.4 oz (104.1 kg)   SpO2 96%   BMI 32.92 kg/m   Wt Readings from Last 3 Encounters:  02/23/17 229 lb 6.4 oz (104.1 kg)  10/23/16 233 lb 8 oz (105.9 kg)  10/12/16 230 lb (104.3 kg)    Physical Exam  Constitutional: He appears well-developed and well-nourished. No distress.  obese  HENT:  Head: Normocephalic and atraumatic.  Eyes: EOM are normal. No scleral icterus.  Neck: Carotid bruit is not present. No thyromegaly present.  Cardiovascular: Normal rate and regular rhythm.  Pulmonary/Chest: Effort normal and breath sounds normal.  Abdominal: Normal appearance and bowel sounds are normal. He exhibits no distension.  Neurological: Coordination normal.  Skin: Skin is warm and dry. No pallor.  Crusted herpetic lesion on the lower lip centrally  Psychiatric: He has a normal mood and affect. His behavior is normal. His mood appears not anxious. He does not exhibit a depressed mood.   Results for orders placed or performed in visit on 16/10/96  Basic metabolic panel  Result Value Ref Range   Sodium 139 135 - 146 mmol/L   Potassium 4.0 3.5 - 5.3 mmol/L   Chloride 105 98 - 110 mmol/L   CO2 21 20 - 31 mmol/L   Glucose, Bld 104 (H) 65 - 99 mg/dL   BUN 14 7 - 25 mg/dL   Creat 0.93 0.70 - 1.33 mg/dL   Calcium 9.0 8.6 - 10.3 mg/dL  Hemoglobin A1c  Result Value Ref Range   Hgb A1c MFr Bld 5.6 <5.7 %   Mean Plasma Glucose 114 mg/dL  Lipid panel  Result Value Ref Range   Cholesterol 190 <200 mg/dL   Triglycerides 146 <150 mg/dL   HDL 37 (L) >40 mg/dL   Total CHOL/HDL Ratio 5.1 (H) <5.0 Ratio   VLDL 29 <30 mg/dL   LDL Cholesterol 124 (H) <100 mg/dL      Assessment & Plan:   Problem List Items Addressed This Visit      Cardiovascular and Mediastinum   Coronary artery calcification seen on CAT scan (Chronic)    Showed cross-sectional model of an artery at various stages of atherosclerosis; discussed plaque regression if we can get the  LDL under 70; he sees cardiologist      Aortic  atherosclerosis ([place]) (Chronic)    Noted on chest CT; reviewed stages of atherosclerosis and the opportunity for plaque regression with statins and lowered LDL; also encouraged weight loss; we'll recheck lipids in another week or so and then consider        Digestive   Fever blister    Zovirax topical; no suppression needed      Relevant Medications   acyclovir ointment (ZOVIRAX) 5 %     Other   Pre-diabetes    Last A1c was actually 5.6 in July; encouraged weight loss; if he could get his weight down to BMI of less than 30, that would help, and if he could BMI under 25, that would be ideal      Obesity (BMI 30-39.9) (Chronic)    Encouraged weight loss; reviewed BMI calculator with him; found target weights to get him out of obesity category first and then out of overweight category down the road; if he does not believe the target weights, I encouraged him to try one of the measurement devices as local gyms to measure excess fat versus water and muscle weight; he'll give it a go      Hyperlipidemia with target low density lipoprotein (LDL) cholesterol less than 70 mg/dL (Chronic)    Discussed atherosclerosis, role of statins, dietary influences; we ran through his dietary intake, modifications he could make; check fasting lipids on another day soon at his request and then he'll decide about taking the statin      History of hypertension    Controlled today          Follow up plan: Return in about 6 months (around 08/23/2017).  An after-visit summary was printed and given to the patient at [facility].  Please see the patient instructions which may contain other information and recommendations beyond what is mentioned above in the assessment and plan.  Meds ordered this encounter  Medications  . acyclovir ointment (ZOVIRAX) 5 %    Sig: Apply 1 application topically every 3 (three) hours. While awake for fever blisters    Dispense:   5 g    Refill:  3    No orders of the defined types were placed in this encounter.

## 2017-02-23 NOTE — Patient Instructions (Addendum)
Try to limit egg yolks to no more than 3 per week Start reading labels Return for fasting labs in the next week or two Let's aim for 214 pounds as your next target

## 2017-02-23 NOTE — Assessment & Plan Note (Signed)
Encouraged weight loss; reviewed BMI calculator with him; found target weights to get him out of obesity category first and then out of overweight category down the road; if he does not believe the target weights, I encouraged him to try one of the measurement devices as local gyms to measure excess fat versus water and muscle weight; he'll give it a go

## 2017-02-23 NOTE — Assessment & Plan Note (Signed)
Showed cross-sectional model of an artery at various stages of atherosclerosis; discussed plaque regression if we can get the LDL under 70; he sees cardiologist

## 2017-02-23 NOTE — Assessment & Plan Note (Addendum)
Controlled today 

## 2017-02-23 NOTE — Assessment & Plan Note (Signed)
Noted on chest CT; reviewed stages of atherosclerosis and the opportunity for plaque regression with statins and lowered LDL; also encouraged weight loss; we'll recheck lipids in another week or so and then consider

## 2017-02-23 NOTE — Assessment & Plan Note (Signed)
Zovirax topical; no suppression needed

## 2017-04-04 ENCOUNTER — Telehealth: Payer: Self-pay | Admitting: Family Medicine

## 2017-04-04 NOTE — Telephone Encounter (Signed)
I'm going through outstanding lab orders Please ask patient to have cholesterol panel rechecked in the next week or two We really want to see how he's doing and address his cholesterol High cholesterol contributes to plaque which leads to heart attacks and strokes Thank you

## 2017-04-05 NOTE — Telephone Encounter (Signed)
Left detailed voicemial 

## 2017-06-05 ENCOUNTER — Ambulatory Visit (INDEPENDENT_AMBULATORY_CARE_PROVIDER_SITE_OTHER): Payer: Managed Care, Other (non HMO) | Admitting: Family Medicine

## 2017-06-05 ENCOUNTER — Encounter: Payer: Self-pay | Admitting: Family Medicine

## 2017-06-05 VITALS — BP 122/78 | HR 91 | Temp 98.0°F | Ht 70.25 in | Wt 234.4 lb

## 2017-06-05 DIAGNOSIS — I2510 Atherosclerotic heart disease of native coronary artery without angina pectoris: Secondary | ICD-10-CM

## 2017-06-05 DIAGNOSIS — H6123 Impacted cerumen, bilateral: Secondary | ICD-10-CM | POA: Diagnosis not present

## 2017-06-05 DIAGNOSIS — Z87891 Personal history of nicotine dependence: Secondary | ICD-10-CM | POA: Diagnosis not present

## 2017-06-05 DIAGNOSIS — R21 Rash and other nonspecific skin eruption: Secondary | ICD-10-CM

## 2017-06-05 DIAGNOSIS — Z125 Encounter for screening for malignant neoplasm of prostate: Secondary | ICD-10-CM | POA: Diagnosis not present

## 2017-06-05 DIAGNOSIS — Z0001 Encounter for general adult medical examination with abnormal findings: Secondary | ICD-10-CM | POA: Diagnosis not present

## 2017-06-05 DIAGNOSIS — Z0000 Encounter for general adult medical examination without abnormal findings: Secondary | ICD-10-CM

## 2017-06-05 NOTE — Assessment & Plan Note (Signed)
Refer to new local cardiologist; no sx; proud of his dietary changes

## 2017-06-05 NOTE — Assessment & Plan Note (Signed)
USPSTF grade A and B recommendations reviewed with patient; age-appropriate recommendations, preventive care, screening tests, etc discussed and encouraged; healthy living encouraged; see AVS for patient education given to patient  

## 2017-06-05 NOTE — Patient Instructions (Addendum)
Try cerumenex or debrox Then return in 2 weeks to have ears flushed again  We'll send copies of your labs to your dermatologist  Health Maintenance, Male A healthy lifestyle and preventive care is important for your health and wellness. Ask your health care provider about what schedule of regular examinations is right for you. What should I know about weight and diet? Eat a Healthy Diet  Eat plenty of vegetables, fruits, whole grains, low-fat dairy products, and lean protein.  Do not eat a lot of foods high in solid fats, added sugars, or salt.  Maintain a Healthy Weight Regular exercise can help you achieve or maintain a healthy weight. You should:  Do at least 150 minutes of exercise each week. The exercise should increase your heart rate and make you sweat (moderate-intensity exercise).  Do strength-training exercises at least twice a week.  Watch Your Levels of Cholesterol and Blood Lipids  Have your blood tested for lipids and cholesterol every 5 years starting at 58 years of age. If you are at high risk for heart disease, you should start having your blood tested when you are 58 years old. You may need to have your cholesterol levels checked more often if: ? Your lipid or cholesterol levels are high. ? You are older than 57 years of age. ? You are at high risk for heart disease.  What should I know about cancer screening? Many types of cancers can be detected early and may often be prevented. Lung Cancer  You should be screened every year for lung cancer if: ? You are a current smoker who has smoked for at least 30 years. ? You are a former smoker who has quit within the past 15 years.  Talk to your health care provider about your screening options, when you should start screening, and how often you should be screened.  Colorectal Cancer  Routine colorectal cancer screening usually begins at 58 years of age and should be repeated every 5-10 years until you are 58 years  old. You may need to be screened more often if early forms of precancerous polyps or small growths are found. Your health care provider may recommend screening at an earlier age if you have risk factors for colon cancer.  Your health care provider may recommend using home test kits to check for hidden blood in the stool.  A small camera at the end of a tube can be used to examine your colon (sigmoidoscopy or colonoscopy). This checks for the earliest forms of colorectal cancer.  Prostate and Testicular Cancer  Depending on your age and overall health, your health care provider may do certain tests to screen for prostate and testicular cancer.  Talk to your health care provider about any symptoms or concerns you have about testicular or prostate cancer.  Skin Cancer  Check your skin from head to toe regularly.  Tell your health care provider about any new moles or changes in moles, especially if: ? There is a change in a mole's size, shape, or color. ? You have a mole that is larger than a pencil eraser.  Always use sunscreen. Apply sunscreen liberally and repeat throughout the day.  Protect yourself by wearing long sleeves, pants, a wide-brimmed hat, and sunglasses when outside.  What should I know about heart disease, diabetes, and high blood pressure?  If you are 42-65 years of age, have your blood pressure checked every 3-5 years. If you are 110 years of age or older, have  your blood pressure checked every year. You should have your blood pressure measured twice-once when you are at a hospital or clinic, and once when you are not at a hospital or clinic. Record the average of the two measurements. To check your blood pressure when you are not at a hospital or clinic, you can use: ? An automated blood pressure machine at a pharmacy. ? A home blood pressure monitor.  Talk to your health care provider about your target blood pressure.  If you are between 41-26 years old, ask your  health care provider if you should take aspirin to prevent heart disease.  Have regular diabetes screenings by checking your fasting blood sugar level. ? If you are at a normal weight and have a low risk for diabetes, have this test once every three years after the age of 58. ? If you are overweight and have a high risk for diabetes, consider being tested at a younger age or more often.  A one-time screening for abdominal aortic aneurysm (AAA) by ultrasound is recommended for men aged 86-75 years who are current or former smokers. What should I know about preventing infection? Hepatitis B If you have a higher risk for hepatitis B, you should be screened for this virus. Talk with your health care provider to find out if you are at risk for hepatitis B infection. Hepatitis C Blood testing is recommended for:  Everyone born from [birth year] through 1966.  Anyone with known risk factors for hepatitis C.  Sexually Transmitted Diseases (STDs)  You should be screened each year for STDs including gonorrhea and chlamydia if: ? You are sexually active and are younger than 58 years of age. ? You are older than 58 years of age and your health care provider tells you that you are at risk for this type of infection. ? Your sexual activity has changed since you were last screened and you are at an increased risk for chlamydia or gonorrhea. Ask your health care provider if you are at risk.  Talk with your health care provider about whether you are at high risk of being infected with HIV. Your health care provider may recommend a prescription medicine to help prevent HIV infection.  What else can I do?  Schedule regular health, dental, and eye exams.  Stay current with your vaccines (immunizations).  Do not use any tobacco products, such as cigarettes, chewing tobacco, and e-cigarettes. If you need help quitting, ask your health care provider.  Limit alcohol intake to no more than 2 drinks per day. One  drink equals 12 ounces of beer, 5 ounces of wine, or 1 ounces of hard liquor.  Do not use street drugs.  Do not share needles.  Ask your health care provider for help if you need support or information about quitting drugs.  Tell your health care provider if you often feel depressed.  Tell your health care provider if you have ever been abused or do not feel safe at home. This information is not intended to replace advice given to you by your health care provider. Make sure you discuss any questions you have with your health care provider. Document Released: 09/09/2007 Document Revised: 11/10/2015 Document Reviewed: 12/15/2014 Elsevier Interactive Patient Education  2018 [place], Adult The ears produce a substance called earwax that helps keep bacteria out of the ear and protects the skin in the ear canal. Occasionally, earwax can build up in the ear and cause discomfort or hearing  loss. What increases the risk? This condition is more likely to develop in people who:  Are male.  Are elderly.  Naturally produce more earwax.  Clean their ears often with cotton swabs.  Use earplugs often.  Use in-ear headphones often.  Wear hearing aids.  Have narrow ear canals.  Have earwax that is overly thick or sticky.  Have eczema.  Are dehydrated.  Have excess hair in the ear canal.  What are the signs or symptoms? Symptoms of this condition include:  Reduced or muffled hearing.  A feeling of fullness in the ear or feeling that the ear is plugged.  Fluid coming from the ear.  Ear pain.  Ear itch.  Ringing in the ear.  Coughing.  An obvious piece of earwax that can be seen inside the ear canal.  How is this diagnosed? This condition may be diagnosed based on:  Your symptoms.  Your medical history.  An ear exam. During the exam, your health care provider will look into your ear with an instrument called an otoscope.  You may have tests,  including a hearing test. How is this treated? This condition may be treated by:  Using ear drops to soften the earwax.  Having the earwax removed by a health care provider. The health care provider may: ? Flush the ear with water. ? Use an instrument that has a loop on the end (curette). ? Use a suction device.  Surgery to remove the wax buildup. This may be done in severe cases.  Follow these instructions at home:  Take over-the-counter and prescription medicines only as told by your health care provider.  Do not put any objects, including cotton swabs, into your ear. You can clean the opening of your ear canal with a washcloth or facial tissue.  Follow instructions from your health care provider about cleaning your ears. Do not over-clean your ears.  Drink enough fluid to keep your urine clear or pale yellow. This will help to thin the earwax.  Keep all follow-up visits as told by your health care provider. If earwax builds up in your ears often or if you use hearing aids, consider seeing your health care provider for routine, preventive ear cleanings. Ask your health care provider how often you should schedule your cleanings.  If you have hearing aids, clean them according to instructions from the manufacturer and your health care provider. Contact a health care provider if:  You have ear pain.  You develop a fever.  You have blood, pus, or other fluid coming from your ear.  You have hearing loss.  You have ringing in your ears that does not go away.  Your symptoms do not improve with treatment.  You feel like the room is spinning (vertigo). Summary  Earwax can build up in the ear and cause discomfort or hearing loss.  The most common symptoms of this condition include reduced or muffled hearing and a feeling of fullness in the ear or feeling that the ear is plugged.  This condition may be diagnosed based on your symptoms, your medical history, and an ear  exam.  This condition may be treated by using ear drops to soften the earwax or by having the earwax removed by a health care provider.  Do not put any objects, including cotton swabs, into your ear. You can clean the opening of your ear canal with a washcloth or facial tissue. This information is not intended to replace advice given to you by your  health care provider. Make sure you discuss any questions you have with your health care provider. Document Released: 04/20/2004 Document Revised: 05/24/2016 Document Reviewed: 05/24/2016 Elsevier Interactive Patient Education  [employer].

## 2017-06-05 NOTE — Assessment & Plan Note (Signed)
Order CT scan

## 2017-06-05 NOTE — Progress Notes (Signed)
Patient ID: Edward Bean, male   DOB: March 14, 1961, 57 y.o.   MRN: [id number]   Subjective:   Edward Bean is a 57 y.o. male here for a complete physical exam.  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 He quit smoking 2 years ago and has been making positive changes in his diet. He lives with his Edward Bean who also quit smoking and have been cooking healthier food options- recently bough an Information systems manager. Patient denies health concerns at this time.   Interim issues since last visit:  USPSTF grade A and B recommendations Depression:  Depression screen [facility] 2/9 06/05/2017 02/23/2017 10/23/2016 06/27/2016 05/31/2016  Decreased Interest 0 0 0 0 0  Down, Depressed, Hopeless 0 0 0 0 0  PHQ - 2 Score 0 0 0 0 0   Hypertension: BP Readings from Last 3 Encounters:  06/05/17 122/78  02/23/17 122/80  10/23/16 128/78   Obesity: Wt Readings from Last 3 Encounters:  06/05/17 234 lb 6.4 oz (106.3 kg)  02/23/17 229 lb 6.4 oz (104.1 kg)  10/23/16 233 lb 8 oz (105.9 kg)   BMI Readings from Last 3 Encounters:  06/05/17 33.39 kg/m  02/23/17 32.92 kg/m  10/23/16 33.50 kg/m    Immunizations: patient politely declined.  Skin cancer: patient has rash on lower back mid spine. Denies itching, or pain. Edward Bean noticed 2 weeks ago. Sprayed anti-fungal and has not gone away. Patient states has dermatologist that watches two moles on his face annually.  May have been there longer; not irritating at all; not worse after hot water; nothing similar in the past at all; otherwise feels fine; no headaches, no neck stiffness; urine may be darker  Lung cancer:  Quit 18 months ago- greater than 30 pack year history. Will Order Prostate cancer: denies history or family history of prostate cancer. Denies weak stream or dribbling.  Lab Results  Component Value Date   PSA 0.5 05/31/2016   Colorectal cancer: last completed 2014- next  due is 2024 AAA: n/a Aspirin: takes a daily aspirin  Diet: reduced amount of soft drinks and ice tea, red meat, and eggs. Patient tried to include more vegetables in his diet.  Exercise: patient is back at work so he is more active but does not have an exercise routine  Alcohol: occasionally- for social events and will drink maybe 6 beers at a time.  Tobacco use: quit  HIV, hep B, hep C: politely declined  STD testing and prevention (chl/gon/syphilis): politely declined  Glucose: will check today  Glucose, Bld  Date Value Ref Range Status  10/23/2016 104 (H) 65 - 99 mg/dL Final  05/31/2016 113 (H) 65 - 99 mg/dL Final  02/10/2016 173 (H) 65 - 99 mg/dL Final   Lipids:  Lab Results  Component Value Date   CHOL 190 10/23/2016   CHOL 199 05/31/2016   CHOL 181 05/05/2015   Lab Results  Component Value Date   HDL 37 (L) 10/23/2016   HDL 38 (L) 05/31/2016   HDL 35 (L) 05/05/2015   Lab Results  Component Value Date   LDLCALC 124 (H) 10/23/2016   LDLCALC 135 (H) 05/31/2016   LDLCALC 123 (H) 05/05/2015   Lab Results  Component Value Date   TRIG 146 10/23/2016   TRIG 130 05/31/2016   TRIG 117 05/05/2015   Lab Results  Component Value Date   CHOLHDL 5.1 (H) 10/23/2016  CHOLHDL 5.2 (H) 05/31/2016   CHOLHDL 5.2 (H) 05/05/2015   No results found for: LDLDIRECT   Past Medical History:  Diagnosis Date  . Aortic atherosclerosis ([place]) 06/27/2016   Chest CT march 2018  . Asthma    as child  . Coronary artery disease 06/27/2016  . Family history of premature CAD 04/02/2013  . Hx of tobacco use, presenting hazards to health 05/31/2016   Quit Dec 2017  . Hyperlipidemia LDL goal <130 04/02/2013  . Hypertension, essential   . Impaired glucose tolerance in obese   . Kidney stone   . Metabolic syndrome    Obese; HLD (TG 243), Low HDL (34), HTN  . Obesity (BMI 30-39.9) 04/02/2013  . OSA (obstructive sleep apnea)    Not currently on CPAP; clinical diagnosis by PCP  . Pulmonary  nodules/lesions, multiple 06/27/2016   Chest CT 2018  . Sleep apnea   . Smoking greater than 10 pack years 04/02/2013  . Syncope    Past Surgical History:  Procedure Laterality Date  . GXT/ETT  08/2016   Normal LV size. Moderate LVH. EF 60-65%. GR 1 DD. Indeterminate LV filling pressures (less than last echo). Mild aortic stenosis (peak gradient 24 mmHg. MAC. Mild biatrial enlargement.    Marland Kitchen KNEE SURGERY     Extensive L Knee surgery  . Treadmill Myoview  03/2013   Exercise 10 min; 12 METS (excellent exercise tolerance)) - read 169 bpm (101% of max predicted) - no ECG or scintigraphic evidence of ischemia or infarction   Family History  Problem Relation Age of Onset  . Esophageal cancer Mother   . Heart attack Mother 59  . Hypertension Mother   . Bladder Cancer Mother   . Cancer Mother   . Lung cancer Father        Long term smoker  . Heart attack Father 60       MI -> ~12 yrs later --> CABG  . Hyperlipidemia Father   . Kidney disease Neg Hx   . Prostate cancer Neg Hx    Social History   Tobacco Use  . Smoking status: Former Smoker    Packs/day: 1.00    Years: 30.00    Pack years: 30.00    Types: Cigarettes    Last attempt to quit: 02/25/2016    Years since quitting: 1.2  . Smokeless tobacco: Former Systems developer    Quit date: 03/24/2013  . Tobacco comment: Smoked off & on for ~20 yrs prior to the last ~10 yr spell  Substance Use Topics  . Alcohol use: Yes    Alcohol/week: 0.0 oz    Comment: ocassional; usually not bothered by 2-3 beers +/- mixed drinks  . Drug use: No   Review of Systems  Constitutional: Negative for chills and fatigue. Activity change: more activity at work, no physical exercise.  HENT: Negative for congestion, sinus pain and sneezing (over the weekend but resolved).   Eyes: Negative for visual disturbance (wears glassess, annual optometry visit).  Respiratory: Negative for cough, chest tightness, shortness of breath and wheezing.   Cardiovascular: Negative  for chest pain, palpitations and leg swelling.  Gastrointestinal: Negative for abdominal pain, constipation, diarrhea and nausea.  Endocrine: Negative for cold intolerance, heat intolerance, polydipsia, polyphagia and polyuria.  Genitourinary: Negative for dysuria and frequency.  Musculoskeletal: Negative for gait problem and joint swelling.  Skin: Positive for rash.  Neurological: Negative for dizziness, light-headedness and headaches.  Psychiatric/Behavioral: Negative for behavioral problems, dysphoric mood and sleep disturbance. The  patient is not nervous/anxious.     Objective:   Vitals:   06/05/17 0825  BP: 122/78  Pulse: 91  Temp: 98 F (36.7 C)  TempSrc: Oral  SpO2: 98%  Weight: 234 lb 6.4 oz (106.3 kg)  Height: 5' 10.25" (1.784 m)   Body mass index is 33.39 kg/m. Wt Readings from Last 3 Encounters:  06/05/17 234 lb 6.4 oz (106.3 kg)  02/23/17 229 lb 6.4 oz (104.1 kg)  10/23/16 233 lb 8 oz (105.9 kg)   Physical Exam  Constitutional: He is oriented to person, place, and time. He appears well-developed and well-nourished.  HENT:  Head: Normocephalic.  Right Ear: Hearing and external ear normal. Right ear foreign body: impacted cerumen.  Left Ear: Hearing and external ear normal. Left ear foreign body: impacted cerumen.  Nose: Nose normal. Right sinus exhibits no maxillary sinus tenderness and no frontal sinus tenderness. Left sinus exhibits no maxillary sinus tenderness and no frontal sinus tenderness.  Mouth/Throat: Uvula is midline and oropharynx is clear and moist.  Eyes: Conjunctivae and EOM are normal. Pupils are equal, round, and reactive to light.  Neck: Normal range of motion. Neck supple. No JVD present. Carotid bruit is not present. No thyroid mass and no thyromegaly present.  Cardiovascular: Normal rate, normal heart sounds and intact distal pulses.  Pulmonary/Chest: Effort normal and breath sounds normal. No stridor. No respiratory distress.  Abdominal:  Soft. Bowel sounds are normal. There is no hepatosplenomegaly. There is no tenderness. There is no CVA tenderness.  Musculoskeletal: Normal range of motion.  Lymphadenopathy:    He has no cervical adenopathy.  Neurological: He is alert and oriented to person, place, and time. He has normal strength and normal reflexes. GCS eye subscore is 4. GCS verbal subscore is 5. GCS motor subscore is 6.  Skin: Skin is warm and dry. Rash (erythematous to dusky violaceous polygonal lesions in the midline back) noted.     Psychiatric: He has a normal mood and affect. His behavior is normal. Judgment and thought content normal.    Assessment/Plan:   Problem List Items Addressed This Visit      Cardiovascular and Mediastinum   Coronary artery calcification seen on CAT scan (Chronic)    Refer to new local cardiologist; no sx; proud of his dietary changes      Relevant Orders   Ambulatory referral to Cardiology     Other   Prostate cancer screening    Discussed, shared decision making; he opts for PSA only      Relevant Orders   PSA   Preventative health care - Primary    USPSTF grade A and B recommendations reviewed with patient; age-appropriate recommendations, preventive care, screening tests, etc discussed and encouraged; healthy living encouraged; see AVS for patient education given to patient       Relevant Orders   CBC with Differential/Platelet   COMPLETE METABOLIC PANEL WITH GFR   Lipid panel   Personal history of tobacco use, presenting hazards to health    Order CT scan      Relevant Orders   CT CHEST LUNG CA SCREEN LOW DOSE W/O CM    Other Visit Diagnoses    Bilateral impacted cerumen       Relevant Orders   Ear Lavage   Rash       Relevant Orders   Urinalysis w microscopic + reflex cultur   ANCA Screen Reflex Titer   C-reactive protein   Complement, total   ANA,IFA RA  Diag Pnl w/rflx Tit/Patn      No orders of the defined types were placed in this  encounter.  Orders Placed This Encounter  Procedures  . CT CHEST LUNG CA SCREEN LOW DOSE W/O CM    Standing Status:   Future    Standing Expiration Date:   08/06/2018    Order Specific Question:   Reason for Exam (SYMPTOM  OR DIAGNOSIS REQUIRED)    Answer:   30+ pack years    Order Specific Question:   Radiology Contrast Protocol - do NOT remove file path    Answer:   [email or web address]  . CBC with Differential/Platelet  . COMPLETE METABOLIC PANEL WITH GFR  . Lipid panel  . PSA  . Urinalysis w microscopic + reflex cultur  . ANCA Screen Reflex Titer  . C-reactive protein  . Complement, total  . ANA,IFA RA Diag Pnl w/rflx Tit/Patn  . Ambulatory referral to Cardiology    Referral Priority:   Routine    Referral Type:   Consultation    Referral Reason:   Specialty Services Required    Referred to Provider:   Minna Merritts, MD    Requested Specialty:   Cardiology    Number of Visits Requested:   1  . Ear Lavage    Follow up plan: Return in about 1 year (around 06/06/2018) for complete physical.  1. Bilateral impacted cerumen Discussed OTC drops and will come back in 2 weeks for repeat attempt to lavage.  - Ear Lavage  2. Preventative health care Discussed diet and exercise. Encouraged patient on positive changes he has already made and will check blood work to see how he has done.  - CBC with Differential/Platelet - COMPLETE METABOLIC PANEL WITH GFR - Lipid panel  3. Prostate cancer screening Shared decision-making to do PSA and not DRE - PSA  4. Personal history of tobacco use, presenting hazards to health Encouraged to continue to not smoke - CT CHEST LUNG CA SCREEN LOW DOSE W/O CM; Future  5. Rash Will follow-up with derm  - Urinalysis w microscopic + reflex cultur - ANCA Screen Reflex Titer - C-reactive protein - Complement, total - ANA,IFA RA Diag Pnl w/rflx Tit/Patn  6. Coronary artery calcification seen on CAT scan Requested  local cardiologist- will refer  - Ambulatory referral to Cardiology   An After Visit Summary was printed and given to the patient.

## 2017-06-05 NOTE — Assessment & Plan Note (Signed)
Discussed, shared decision making; he opts for PSA only

## 2017-06-07 ENCOUNTER — Telehealth: Payer: Self-pay | Admitting: *Deleted

## 2017-06-07 ENCOUNTER — Other Ambulatory Visit: Payer: Self-pay | Admitting: Family Medicine

## 2017-06-07 LAB — ANA,IFA RA DIAG PNL W/RFLX TIT/PATN
ANA: NEGATIVE
Cyclic Citrullin Peptide Ab: <16 UNITS
Rhuematoid fact SerPl-aCnc: <14 IU/mL (ref <14)

## 2017-06-07 LAB — COMPLEMENT, TOTAL

## 2017-06-07 NOTE — Telephone Encounter (Signed)
Left message for patient to notify them that it is time to schedule annual low dose lung cancer screening CT scan. Instructed patient to call back to verify information prior to the scan being scheduled.  

## 2017-06-08 ENCOUNTER — Telehealth: Payer: Self-pay | Admitting: *Deleted

## 2017-06-08 DIAGNOSIS — Z122 Encounter for screening for malignant neoplasm of respiratory organs: Secondary | ICD-10-CM

## 2017-06-08 DIAGNOSIS — Z87891 Personal history of nicotine dependence: Secondary | ICD-10-CM

## 2017-06-08 MED ORDER — ATORVASTATIN CALCIUM 20 MG PO TABS: 20.0000 mg | ORAL_TABLET | Freq: Every day | ORAL | 1 refills | Status: DC

## 2017-06-08 NOTE — Progress Notes (Signed)
Need pharmacy Thank you

## 2017-06-08 NOTE — Telephone Encounter (Signed)
Notified patient that annual lung cancer screening low dose CT scan is due currently or will be in near future. Confirmed that patient is within the age range of 55-77, and asymptomatic, (no signs or symptoms of lung cancer). Patient denies illness that would prevent curative treatment for lung cancer if found. Verified smoking history, (former, quit 02/25/16, 30 pack year). The shared decision making visit was done 06/13/16. Patient is agreeable for CT scan being scheduled.

## 2017-06-12 LAB — COMPLETE METABOLIC PANEL WITH GFR
AG RATIO: 1.7 (calc) (ref 1.0–2.5)
ALBUMIN MSPROF: 4.3 g/dL (ref 3.6–5.1)
ALT: 25 U/L (ref 9–46)
AST: 21 U/L (ref 10–35)
Alkaline phosphatase (APISO): 34 U/L — ABNORMAL LOW (ref 40–115)
BILIRUBIN TOTAL: 0.7 mg/dL (ref 0.2–1.2)
BUN: 17 mg/dL (ref 7–25)
CHLORIDE: 106 mmol/L (ref 98–110)
CO2: 25 mmol/L (ref 20–32)
Calcium: 9 mg/dL (ref 8.6–10.3)
Creat: 0.9 mg/dL (ref 0.70–1.33)
GFR, EST AFRICAN AMERICAN: 109 mL/min/{1.73_m2} (ref >=60)
GFR, Est Non African American: 94 mL/min/{1.73_m2} (ref >=60)
GLOBULIN: 2.5 g/dL (ref 1.9–3.7)
Glucose, Bld: 102 mg/dL — ABNORMAL HIGH (ref 65–99)
POTASSIUM: 3.9 mmol/L (ref 3.5–5.3)
Sodium: 139 mmol/L (ref 135–146)
TOTAL PROTEIN: 6.8 g/dL (ref 6.1–8.1)

## 2017-06-12 LAB — LIPID PANEL
Cholesterol: 193 mg/dL (ref <200)
HDL: 38 mg/dL — ABNORMAL LOW (ref >40)
LDL Cholesterol (Calc): 133 mg/dL (calc) — ABNORMAL HIGH
Non-HDL Cholesterol (Calc): 155 mg/dL (calc) — ABNORMAL HIGH (ref <130)
Total CHOL/HDL Ratio: 5.1 (calc) — ABNORMAL HIGH (ref <5.0)
Triglycerides: 114 mg/dL (ref <150)

## 2017-06-12 LAB — URINALYSIS W MICROSCOPIC + REFLEX CULTURE
Bacteria, UA: NONE SEEN /HPF
Bilirubin Urine: NEGATIVE
GLUCOSE, UA: NEGATIVE
HGB URINE DIPSTICK: NEGATIVE
HYALINE CAST: NONE SEEN /LPF
Ketones, ur: NEGATIVE
LEUKOCYTE ESTERASE: NEGATIVE
Nitrites, Initial: NEGATIVE
Protein, ur: NEGATIVE
RBC / HPF: NONE SEEN /HPF (ref 0–2)
SPECIFIC GRAVITY, URINE: 1.022 (ref 1.001–1.03)
Squamous Epithelial / LPF: NONE SEEN /HPF (ref <=5)
pH: 5.5 (ref 5.0–8.0)

## 2017-06-12 LAB — CBC WITH DIFFERENTIAL/PLATELET
BASOS PCT: 0.7 %
Basophils Absolute: 38 cells/uL (ref 0–200)
EOS ABS: 59 {cells}/uL (ref 15–500)
Eosinophils Relative: 1.1 %
HCT: 42.5 % (ref 38.5–50.0)
HEMOGLOBIN: 14.8 g/dL (ref 13.2–17.1)
Lymphs Abs: 1442 cells/uL (ref 850–3900)
MCH: 31.9 pg (ref 27.0–33.0)
MCHC: 34.8 g/dL (ref 32.0–36.0)
MCV: 91.6 fL (ref 80.0–100.0)
MPV: 10.2 fL (ref 7.5–12.5)
Monocytes Relative: 6.3 %
NEUTROS ABS: 3521 {cells}/uL (ref 1500–7800)
Neutrophils Relative %: 65.2 %
Platelets: 219 10*3/uL (ref 140–400)
RBC: 4.64 10*6/uL (ref 4.20–5.80)
RDW: 12.5 % (ref 11.0–15.0)
TOTAL LYMPHOCYTE: 26.7 %
WBC: 5.4 10*3/uL (ref 3.8–10.8)
WBCMIX: 340 {cells}/uL (ref 200–950)

## 2017-06-12 LAB — ANA RA PNL1 W/REFL
Anti Nuclear Antibody(ANA): NEGATIVE
Cyclic Citrullin Peptide Ab: <16 UNITS

## 2017-06-12 LAB — NO CULTURE INDICATED

## 2017-06-12 LAB — ANCA SCREEN W REFLEX TITER: ANCA Screen: NEGATIVE

## 2017-06-12 LAB — PSA: PSA: 0.4 ng/mL (ref <=4.0)

## 2017-06-12 LAB — C-REACTIVE PROTEIN: CRP: 2.1 mg/L (ref <8.0)

## 2017-06-18 ENCOUNTER — Ambulatory Visit
Admission: RE | Admit: 2017-06-18 | Discharge: 2017-06-18 | Disposition: A | Discharge status: Home / Self Care | Payer: Managed Care, Other (non HMO) | Source: Ambulatory Visit | Attending: Family Medicine | Admitting: Family Medicine

## 2017-06-18 DIAGNOSIS — I2510 Atherosclerotic heart disease of native coronary artery without angina pectoris: Secondary | ICD-10-CM | POA: Diagnosis not present

## 2017-06-18 DIAGNOSIS — Z87891 Personal history of nicotine dependence: Secondary | ICD-10-CM | POA: Insufficient documentation

## 2017-06-18 DIAGNOSIS — I700 Atherosclerosis of aorta: Secondary | ICD-10-CM | POA: Insufficient documentation

## 2017-06-18 IMAGING — CT CT CHEST LUNG CANCER SCREENING LOW DOSE W/O CM
2 of 6 series · 15 of 40 positions shown, 18 images · non-contrast
Comparison: 06/13/2016

CLINICAL DATA: Thirty pack-year smoking history, quitting in
Thursday February, 2016.

EXAM:
CT CHEST WITHOUT CONTRAST LOW-DOSE FOR LUNG CANCER SCREENING
TECHNIQUE: Multidetector CT imaging of the chest was performed following the
standard protocol without IV contrast.

[Series 4: lung · axial · 0.73mm/px · z∈[-1370,-1095]mm · 12 of 311 slices shown, 15 images (1 of 2)]
[im 18/311  mediastinal]
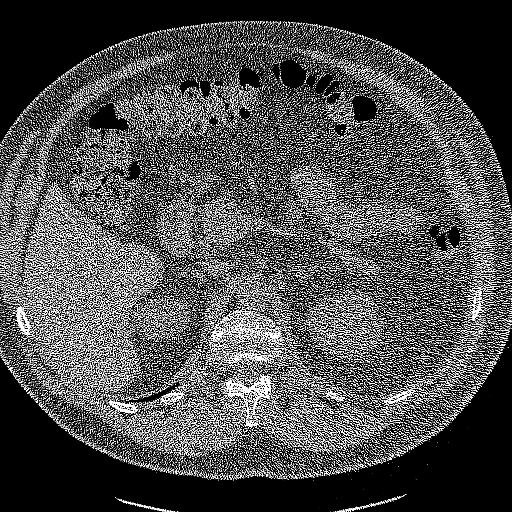
[im 18/311  lung]
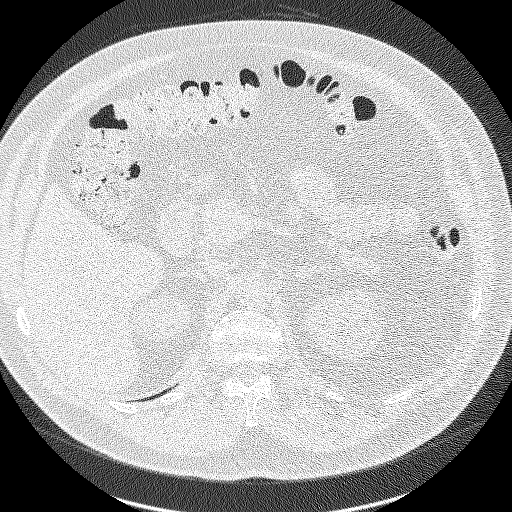
[im 52/311  lung]
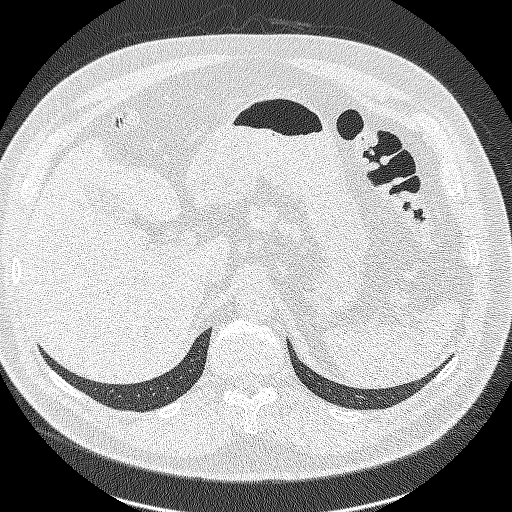
[im 69/311  lung]
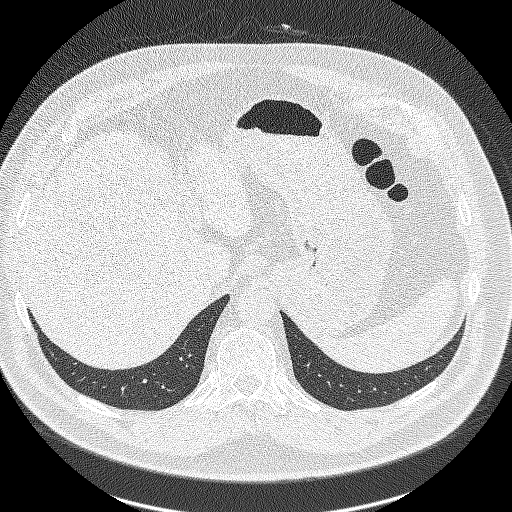
[im 87/311  lung]
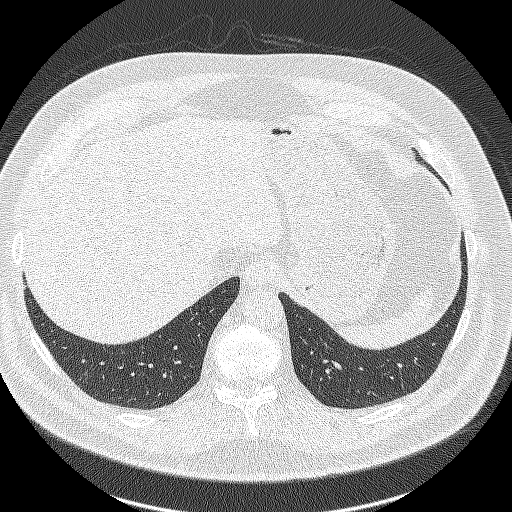
[im 121/311  mediastinal]
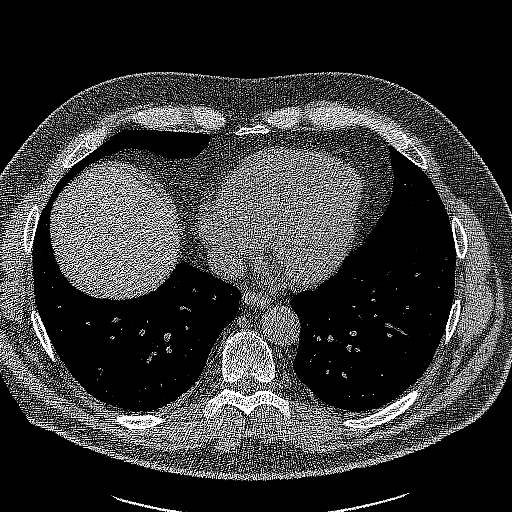
[im 121/311  lung]
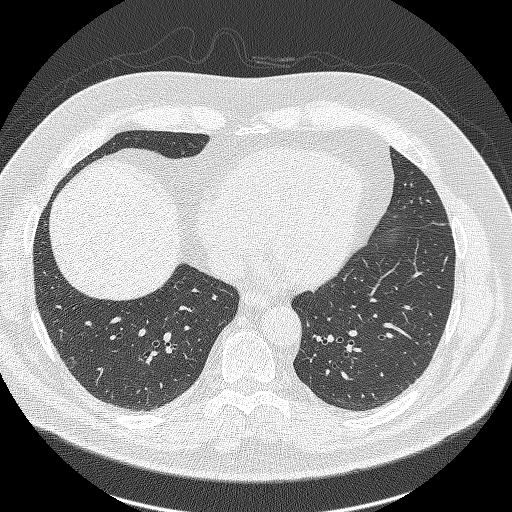
[im 138/311  lung]
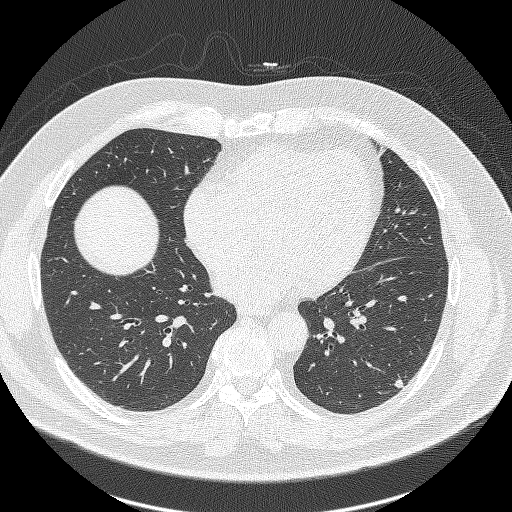
[im 173/311  lung]
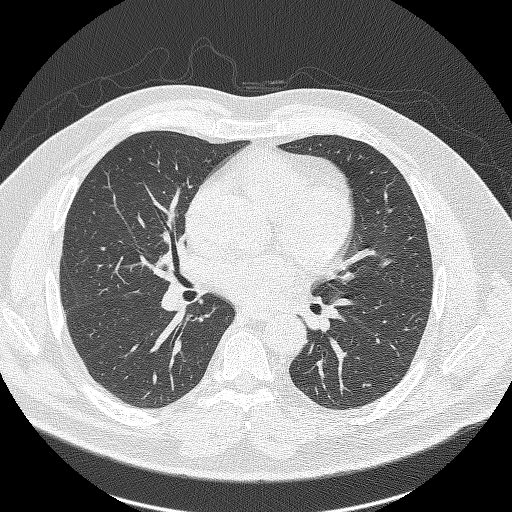
[im 190/311  lung]
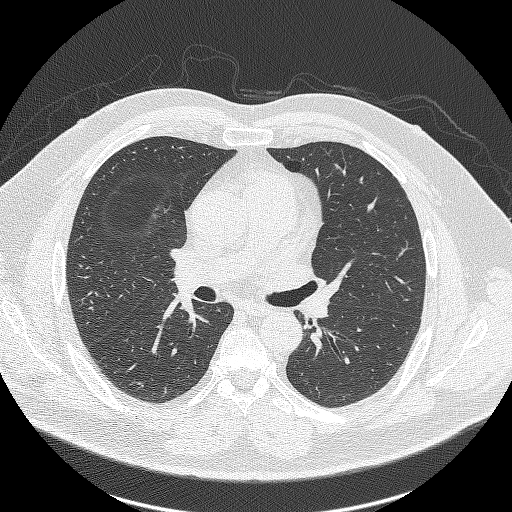
[im 224/311  mediastinal]
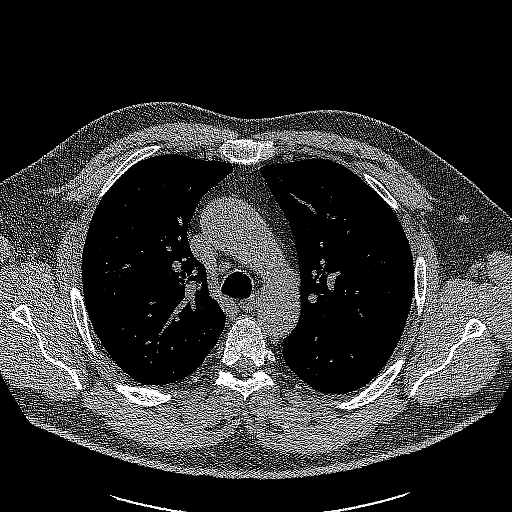
[im 224/311  lung]
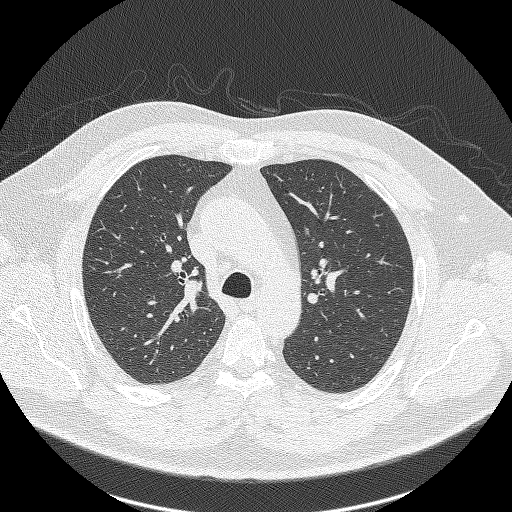
[im 242/311  lung]
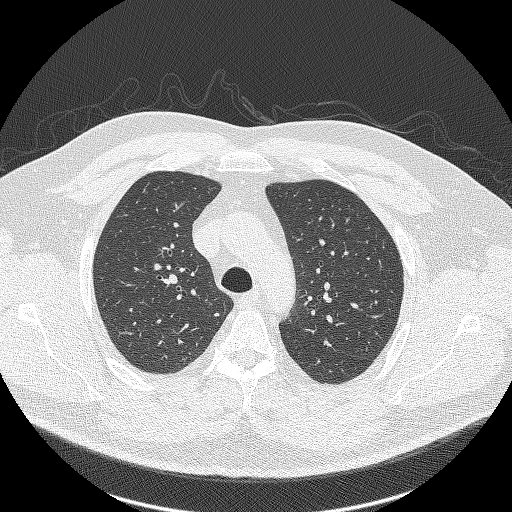
[im 259/311  lung]
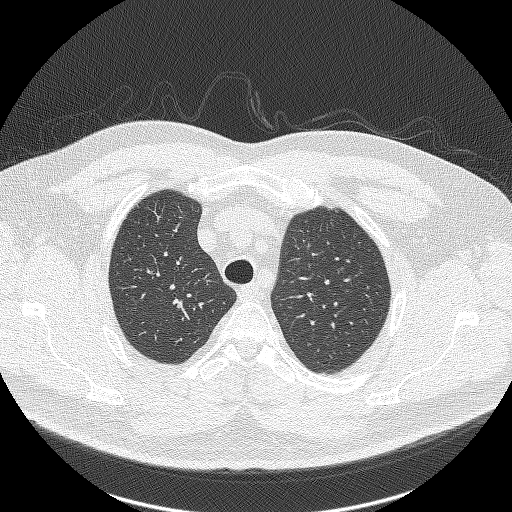
[im 293/311  lung]
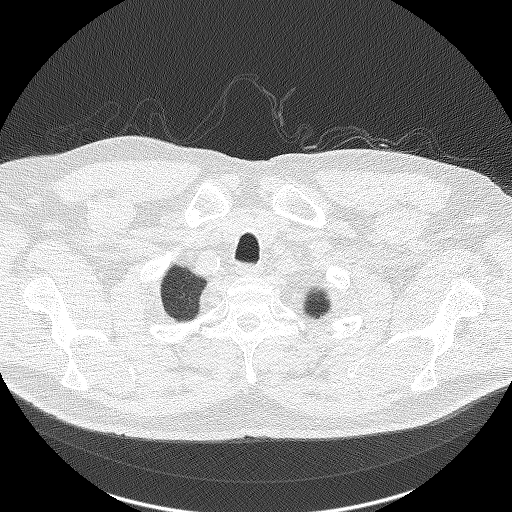

[Series 5: lung · coronal · 0.61mm/px · 3 of 337 slices shown (2 of 2)]
[im 68/337  lung]
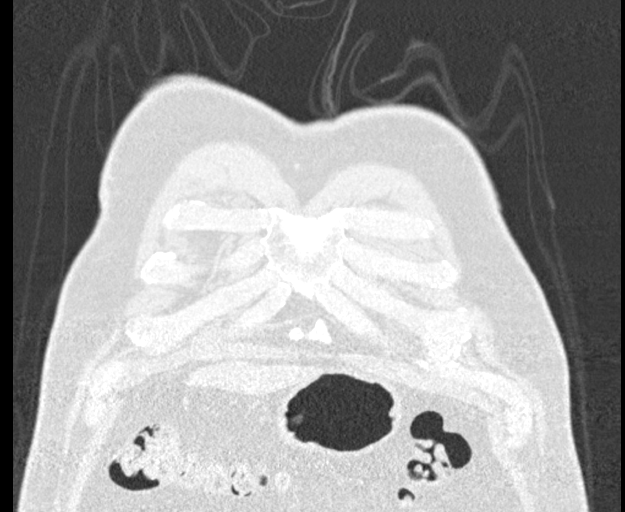
[im 135/337  lung]
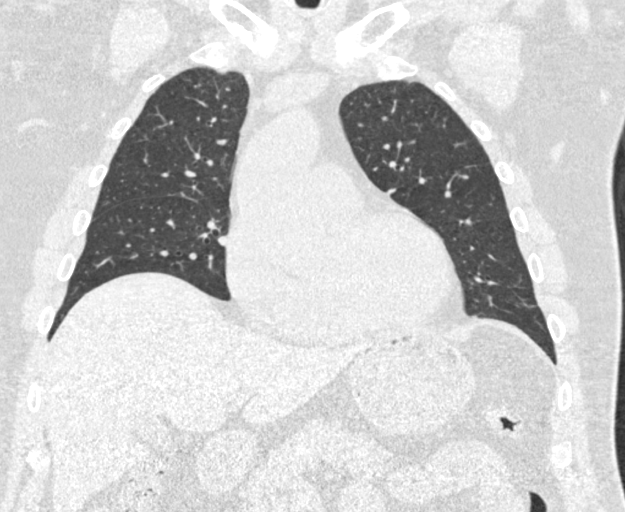
[im 202/337  lung]
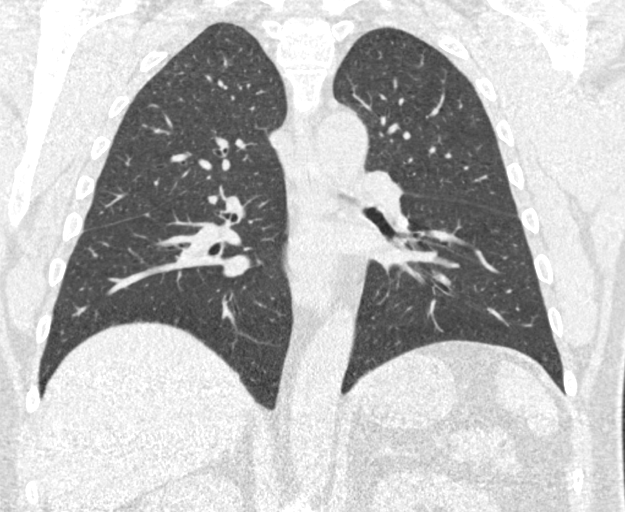

[15 of 40 positions shown; findings below may reference images not displayed]

FINDINGS: Cardiovascular: Aortic and branch vessel atherosclerosis. Normal
heart size, without pericardial effusion. Proximal right coronary
artery atherosclerosis.

Mediastinum/Nodes: No mediastinal or definite hilar adenopathy,
given limitations of unenhanced CT.

Lungs/Pleura: No pleural fluid. Bilateral pulmonary nodules,
maximally volume derived equivalent diameter 6.2 mm in the left
lower lobe, similar.

Upper Abdomen: Normal imaged portions of the liver, spleen, stomach,
pancreas, adrenal glands, kidneys.

Musculoskeletal: No acute osseous abnormality.
IMPRESSION: 1. Lung-RADS 2, benign appearance or behavior. Continue annual
screening with low-dose chest CT without contrast in 12 months.
2. Coronary artery atherosclerosis. Aortic Atherosclerosis
([id number]).

## 2017-06-25 ENCOUNTER — Encounter: Payer: Self-pay | Admitting: *Deleted

## 2017-11-16 ENCOUNTER — Telehealth: Payer: Self-pay | Admitting: Family Medicine

## 2017-11-16 NOTE — Telephone Encounter (Signed)
Please ask patient to schedule an appointment around Sept 13th or just after We'll want to get labs then and talk about whether or not to start an ACE-I; his insurance company suggested that and we'll go over that at his appointment, in addition to his statin use

## 2017-11-19 NOTE — Telephone Encounter (Signed)
Called [phone] and [phone] left voice message informing pt to schedule appt around or after [phone]. Pt can see Benjamine Mola if Dr Sanda Klein is completely booked.

## 2018-05-30 ENCOUNTER — Encounter: Payer: Self-pay | Admitting: *Deleted

## 2018-06-07 ENCOUNTER — Ambulatory Visit (INDEPENDENT_AMBULATORY_CARE_PROVIDER_SITE_OTHER): Payer: Managed Care, Other (non HMO) | Admitting: Nurse Practitioner

## 2018-06-07 ENCOUNTER — Other Ambulatory Visit: Payer: Self-pay

## 2018-06-07 ENCOUNTER — Telehealth: Payer: Self-pay | Admitting: Family Medicine

## 2018-06-07 ENCOUNTER — Encounter: Payer: Self-pay | Admitting: Nurse Practitioner

## 2018-06-07 VITALS — BP 122/86 | HR 62 | Temp 98.1°F | Resp 14 | Ht 70.0 in | Wt 242.4 lb

## 2018-06-07 DIAGNOSIS — Z1159 Encounter for screening for other viral diseases: Secondary | ICD-10-CM

## 2018-06-07 DIAGNOSIS — Z0000 Encounter for general adult medical examination without abnormal findings: Secondary | ICD-10-CM | POA: Diagnosis not present

## 2018-06-07 DIAGNOSIS — I700 Atherosclerosis of aorta: Secondary | ICD-10-CM

## 2018-06-07 DIAGNOSIS — E785 Hyperlipidemia, unspecified: Secondary | ICD-10-CM

## 2018-06-07 DIAGNOSIS — R7303 Prediabetes: Secondary | ICD-10-CM

## 2018-06-07 DIAGNOSIS — Z125 Encounter for screening for malignant neoplasm of prostate: Secondary | ICD-10-CM

## 2018-06-07 DIAGNOSIS — I2510 Atherosclerotic heart disease of native coronary artery without angina pectoris: Secondary | ICD-10-CM

## 2018-06-07 NOTE — Patient Instructions (Addendum)
General recommendations: 150 minutes of physical activity weekly, eat two servings of fish weekly, eat one serving of tree nuts ( cashews, pistachios, pecans, almonds.Marland Kitchen) every other day, eat 6 servings of fruit/vegetables daily and drink plenty of water and avoid sweet beverages.   Good cholesterol, also called high-density lipoprotein (HDL) removes extra cholesterol and plaque buildup in your arteries and then sends it to your liver to get rid of and helps reduce your risk of heart disease, heart attack, and stroke.Foods that increase HDL: beans and legumes, whole grains, high-fiber fruits:prunes, apples, and pears; fatty fish- salmon, tuna, sardines; nuts, olive oil   Bad cholesterol, also called low-density lipoprotein (LDL), carries cholesterol and other fats that your liver makes to your body tissue. If it builds up in blood vessels, LDL can cause heart disease and other health problems. Your LDL level should be below 100. If you have diabetes or a possible heart problem, your LDL should be below 70.  Eat: Eat 20 to 30 grams of soluble fiber every day. Foods such as fruits and vegetables, whole grains, beans, peas, nuts, and seeds can help lower LDL. Avoid: Saturated fats (Dairy foods - such as butter, cream, ghee, regular-fat milk and cheese. Meat - such as fatty cuts of beef, pork and lamb, processed meats like salami, sausages and the skin on chicken. Lard., fatty snack foods, cakes, biscuits, pies and deep fried foods) Avoid smoking   Preventive Care 40-64 Years, Male Preventive care refers to lifestyle choices and visits with your health care provider that can promote health and wellness. What does preventive care include?  A yearly physical exam. This is also called an annual well check.  Dental exams once or twice a year.  Routine eye exams. Ask your health care provider how often you should have your eyes checked.  Personal lifestyle choices, including: ? Daily care of your teeth and  gums. ? Regular physical activity. ? Eating a healthy diet. ? Avoiding tobacco and drug use. ? Limiting alcohol use. ? Practicing safe sex. ? Taking low-dose aspirin every day starting at age 49. What happens during an annual well check? The services and screenings done by your health care provider during your annual well check will depend on your age, overall health, lifestyle risk factors, and family history of disease. Counseling Your health care provider may ask you questions about your:  Alcohol use.  Tobacco use.  Drug use.  Emotional well-being.  Home and relationship well-being.  Sexual activity.  Eating habits.  Work and work Statistician. Screening You may have the following tests or measurements:  Height, weight, and BMI.  Blood pressure.  Lipid and cholesterol levels. These may be checked every 5 years, or more frequently if you are over 48 years old.  Skin check.  Lung cancer screening. You may have this screening every year starting at age 57 if you have a 30-pack-year history of smoking and currently smoke or have quit within the past 15 years.  Colorectal cancer screening. All adults should have this screening starting at age 79 and continuing until age 68. Your health care provider may recommend screening at age 25. You will have tests every 1-10 years, depending on your results and the type of screening test. People at increased risk should start screening at an earlier age. Screening tests may include: ? Guaiac-based fecal occult blood testing. ? Fecal immunochemical test (FIT). ? Stool DNA test. ? Virtual colonoscopy. ? Sigmoidoscopy. During this test, a flexible tube with a  tiny camera (sigmoidoscope) is used to examine your rectum and lower colon. The sigmoidoscope is inserted through your anus into your rectum and lower colon. ? Colonoscopy. During this test, a long, thin, flexible tube with a tiny camera (colonoscope) is used to examine your entire  colon and rectum.  Prostate cancer screening. Recommendations will vary depending on your family history and other risks.  Hepatitis C blood test.  Hepatitis B blood test.  Sexually transmitted disease (STD) testing.  Diabetes screening. This is done by checking your blood sugar (glucose) after you have not eaten for a while (fasting). You may have this done every 1-3 years. Discuss your test results, treatment options, and if necessary, the need for more tests with your health care provider. Vaccines Your health care provider may recommend certain vaccines, such as:  Influenza vaccine. This is recommended every year.  Tetanus, diphtheria, and acellular pertussis (Tdap, Td) vaccine. You may need a Td booster every 10 years.  Varicella vaccine. You may need this if you have not been vaccinated.  Zoster vaccine. You may need this after age 10.  Measles, mumps, and rubella (MMR) vaccine. You may need at least one dose of MMR if you were born in 1957 or later. You may also need a second dose.  Pneumococcal 13-valent conjugate (PCV13) vaccine. You may need this if you have certain conditions and have not been vaccinated.  Pneumococcal polysaccharide (PPSV23) vaccine. You may need one or two doses if you smoke cigarettes or if you have certain conditions.  Meningococcal vaccine. You may need this if you have certain conditions.  Hepatitis A vaccine. You may need this if you have certain conditions or if you travel or work in places where you may be exposed to hepatitis A.  Hepatitis B vaccine. You may need this if you have certain conditions or if you travel or work in places where you may be exposed to hepatitis B.  Haemophilus influenzae type b (Hib) vaccine. You may need this if you have certain risk factors. Talk to your health care provider about which screenings and vaccines you need and how often you need them. This information is not intended to replace advice given to you by  your health care provider. Make sure you discuss any questions you have with your health care provider. Document Released: 04/09/2015 Document Revised: 05/03/2017 Document Reviewed: 01/12/2015 Elsevier Interactive Patient Education  2019 [employer].

## 2018-06-07 NOTE — Progress Notes (Signed)
Name: Edward Bean   MRN: [id number]    DOB: 1961/01/25   Date:06/07/2018       Progress Note  Subjective  Chief Complaint  Chief Complaint  Patient presents with  . Annual Exam    HPI  Patient presents for annual CPE .  USPSTF grade A and B recommendations:  Diet:  Changed eating habits this year- trying to take better care of himself. Cut out morning greasy carbs- eating lighter like oatmeal Lunch- smaller meal- chicken, bean, rice  Dinner- chicken, avoids red meats and fast foods At least 1-2 vegetables a day, Fruit- doing smoothies here and there.  Not snacking as much anymore- states during the holiday was eating a lot with holidays and grieving mothers passing.  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   Depression:  Depression screen [facility] 2/9 06/07/2018 06/05/2017 02/23/2017 10/23/2016 06/27/2016  Decreased Interest 0 0 0 0 0  Down, Depressed, Hopeless 0 0 0 0 0  PHQ - 2 Score 0 0 0 0 0  Altered sleeping 0 - - - -  Tired, decreased energy 0 - - - -  Change in appetite 0 - - - -  Feeling bad or failure about yourself  0 - - - -  Trouble concentrating 0 - - - -  Moving slowly or fidgety/restless 0 - - - -  Suicidal thoughts 0 - - - -  PHQ-9 Score 0 - - - -  Difficult doing work/chores Not difficult at all - - - -    Hypertension:  BP Readings from Last 3 Encounters:  06/07/18 122/86  06/05/17 122/78  02/23/17 122/80   Obesity: Wt Readings from Last 3 Encounters:  06/07/18 242 lb 6.4 oz (110 kg)  06/18/17 230 lb (104.3 kg)  06/05/17 234 lb 6.4 oz (106.3 kg)   BMI Readings from Last 3 Encounters:  06/07/18 34.78 kg/m  06/18/17 32.77 kg/m  06/05/17 33.39 kg/m     Lipids:  Lab Results  Component Value Date   CHOL 193 06/05/2017   CHOL 190 10/23/2016   CHOL  199 05/31/2016   Lab Results  Component Value Date   HDL 38 (L) 06/05/2017   HDL 37 (L) 10/23/2016   HDL 38 (L) 05/31/2016   Lab Results  Component Value Date   LDLCALC 133 (H) 06/05/2017   LDLCALC 124 (H) 10/23/2016   LDLCALC 135 (H) 05/31/2016   Lab Results  Component Value Date   TRIG 114 06/05/2017   TRIG 146 10/23/2016   TRIG 130 05/31/2016   Lab Results  Component Value Date   CHOLHDL 5.1 (H) 06/05/2017   CHOLHDL 5.1 (H) 10/23/2016   CHOLHDL 5.2 (H) 05/31/2016   No results found for: LDLDIRECT Glucose:  Glucose, Bld  Date Value Ref Range Status  06/05/2017 102 (H) 65 - 99 mg/dL Final    Comment:    .            Fasting reference interval . For someone without known diabetes, a glucose value between 100 and 125 mg/dL is consistent with prediabetes and should be confirmed with a follow-up test. .   10/23/2016 104 (H) 65 - 99 mg/dL Final  05/31/2016 113 (H) 65 - 99 mg/dL Final  Office Visit from 06/07/2018 in [facility]  AUDIT-C Score  2      Married STD testing and prevention (HIV/chl/gon/syphilis): declines  Hep C: 2017  Skin cancer: sees dermatologist, wears hat when he is out, not in the sun as much.  Colorectal cancer: complete in 2014, next recommended in 2024,states since diet change has healthier bms no blood in stools Prostate cancer: has had yearly PSA screening, discussed risks and benefits; rechecked today  Lab Results  Component Value Date   PSA 0.4 06/05/2017   PSA 0.5 05/31/2016    IPSS Questionnaire (AUA-7): Over the past month.   1)  How often have you had a sensation of not emptying your bladder completely after you finish urinating?  0 - Not at all  2)  How often have you had to urinate again less than two hours after you finished urinating? 1 - Less than 1 time in 5  3)  How often have you found you stopped and started again several times when you urinated?  0 - Not at all  4) How difficult have you  found it to postpone urination?  1 - Less than 1 time in 5  5) How often have you had a weak urinary stream?  0 - Not at all  6) How often have you had to push or strain to begin urination?  0 - Not at all  7) How many times did you most typically get up to urinate from the time you went to bed until the time you got up in the morning?  0 - Not at all   Total score:  0-7 mildly symptomatic   8-19 moderately symptomatic   20-35 severely symptomatic    Lung cancer:   Low Dose CT Chest recommended if Age 72-80 years, 30 pack-year currently smoking OR have quit w/in 15years. Patient does qualify.  Last completed on 06/19/2017- CT chest re-ordered per protocol by Burgess Estelle.  Advanced Care Planning: A voluntary discussion about advance care planning including the explanation and discussion of advance directives.  Discussed health care proxy and Living will, and the patient was able to identify a health care proxy as Wife- Hanad Leino.  Patient does not have a living will at present time. If patient does have living will, I have requested they bring this to the clinic to be scanned in to their chart.  Patient Active Problem List   Diagnosis Date Noted  . Preventative health care 06/05/2017  . Fever blister 02/23/2017  . Medication monitoring encounter 10/31/2016  . Kidney stones 10/23/2016  . Elevated blood pressure reading 10/14/2016  . Aortic atherosclerosis ([place]) 06/27/2016  . Coronary artery calcification seen on CAT scan 06/27/2016  . Pulmonary nodules/lesions, multiple 06/27/2016  . Personal history of tobacco use, presenting hazards to health 05/31/2016  . Prostate cancer screening 05/31/2016  . Annual physical exam 05/05/2015  . Screening for STD (sexually transmitted disease) 05/05/2015  . Abdominal wall hernia 05/05/2015  . Headache, infrequent episodic tension-type 02/10/2015  . Pre-diabetes 02/10/2015  . Hyperlipidemia with target low density lipoprotein (LDL) cholesterol less  than 70 mg/dL 04/02/2013  . Family history of premature CAD 04/02/2013  . Obesity (BMI 30-39.9) 04/02/2013  . History of hypertension     Past Surgical History:  Procedure Laterality Date  . GXT/ETT  08/2016   Normal LV size. Moderate LVH. EF 60-65%. GR 1 DD. Indeterminate LV filling pressures (less than last echo). Mild aortic stenosis (peak gradient 24  mmHg. MAC. Mild biatrial enlargement.    Marland Kitchen KNEE SURGERY     Extensive L Knee surgery  . Treadmill Myoview  03/2013   Exercise 10 min; 12 METS (excellent exercise tolerance)) - read 169 bpm (101% of max predicted) - no ECG or scintigraphic evidence of ischemia or infarction    Family History  Problem Relation Age of Onset  . Esophageal cancer Mother   . Heart attack Mother 54  . Hypertension Mother   . Bladder Cancer Mother   . Cancer Mother   . Lung cancer Father        Long term smoker  . Heart attack Father 75       MI -> ~12 yrs later --> CABG  . Hyperlipidemia Father   . Kidney disease Neg Hx   . Prostate cancer Neg Hx     Social History   Socioeconomic History  . Marital status: Married    Spouse name: Olin Hauser  . Number of children: 1  . Years of education: Not on file  . Highest education level: Some college, no degree  Occupational History    Comment: Self Employed - Full time; Tree [employer]  Social Needs  . Financial resource strain: Not hard at all  . Food insecurity:    Worry: Never true    Inability: Never true  . Transportation needs:    Medical: No    Non-medical: No  Tobacco Use  . Smoking status: Former Smoker    Packs/day: 1.00    Years: 30.00    Pack years: 30.00    Types: Cigarettes    Last attempt to quit: 02/25/2016    Years since quitting: 2.2  . Smokeless tobacco: Former Systems developer    Quit date: 03/24/2013  . Tobacco comment: Smoked off & on for ~20 yrs prior to the last ~10 yr spell  Substance and Sexual Activity  . Alcohol use: Yes    Alcohol/week: 0.0 standard drinks    Comment:  ocassional; usually not bothered by 2-3 beers +/- mixed drinks  . Drug use: No  . Sexual activity: Yes    Partners: Female  Lifestyle  . Physical activity:    Days per week: 5 days    Minutes per session: 150+ min  . Stress: Not at all  Relationships  . Social connections:    Talks on phone: More than three times a week    Gets together: More than three times a week    Attends religious service: More than 4 times per year    Active member of club or organization: Yes    Attends meetings of clubs or organizations: More than 4 times per year    Relationship status: Married  . Intimate partner violence:    Fear of current or ex partner: No    Emotionally abused: No    Physically abused: No    Forced sexual activity: No  Other Topics Concern  . Not on file  Social History Narrative   Married Father of 1 daughter.    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  Prior to that, smoked off & on for ~20+ years.   Drinks occasional social alcohol - moderate; on weekends   Occasionally walks / does light exercise.           Current Outpatient Medications:  .  atorvastatin (LIPITOR) 20 MG tablet, Take 1 tablet (20 mg total) by mouth  at bedtime., Disp: 30 tablet, Rfl: 1 .  aspirin EC 81 MG tablet, Take 1 tablet (81 mg total) by mouth daily., Disp: , Rfl:  .  Aspirin-Salicylamide-Caffeine (BC HEADACHE POWDER PO), Take by mouth as needed., Disp: , Rfl:   Allergies  Allergen Reactions  . Shrimp [Shellfish Allergy]     Itching and rash     Review of Systems  Constitutional: Negative for chills, fever and malaise/fatigue.  HENT: Negative for congestion, ear pain, sinus pain and tinnitus.   Eyes: Negative for blurred vision, double vision and photophobia.  Respiratory: Negative for cough and sputum production.   Cardiovascular: Negative for chest pain, palpitations and leg swelling.  Gastrointestinal: Negative for abdominal  pain, blood in stool, constipation, diarrhea, nausea and vomiting.  Genitourinary: Negative for dysuria and frequency.  Musculoskeletal: Negative for back pain and myalgias.  Skin: Negative for rash.  Neurological: Negative for dizziness, tingling, sensory change, weakness and headaches.  Endo/Heme/Allergies: Negative for polydipsia. Does not bruise/bleed easily.  Psychiatric/Behavioral: Negative for depression. The patient is not nervous/anxious and does not have insomnia.        Objective  Vitals:   06/07/18 0901  BP: 122/86  Pulse: 62  Resp: 14  Temp: 98.1 F (36.7 C)  TempSrc: Oral  SpO2: 94%  Weight: 242 lb 6.4 oz (110 kg)  Height: 5' 10"  (1.778 m)    Body mass index is 34.78 kg/m.  Physical Exam Constitutional: Patient appears well-developed and well-nourished. No distress.  HENT: Head: Normocephalic and atraumatic. Ears: B TMs ok, no erythema or effusion; Nose: Nose normal. Mouth/Throat: Oropharynx is clear and moist. No oropharyngeal exudate.  Eyes: Conjunctivae and EOM are normal. Pupils are equal, round, and reactive to light. No scleral icterus.  Neck: Normal range of motion. Neck supple. No JVD present. No thyromegaly present.  Cardiovascular: Normal rate, regular rhythm and normal heart sounds.  No murmur heard. No BLE edema. Pulmonary/Chest: Effort normal and breath sounds normal. No respiratory distress. Abdominal: Soft. Bowel sounds are normal, no distension. There is no tenderness. no masses MALE GENITALIA: deferred patient preference  Neurological: he is alert and oriented to person, place, and time. No cranial nerve deficit. Coordination, balance, strength, speech and gait are normal.  Skin: Skin is warm and dry. No rash noted. No erythema.  Psychiatric: Patient has a normal mood and affect. behavior is normal. Judgment and thought content normal.   No results found for this or any previous visit (from the past 2160 hour(s)).    Fall Risk: Fall Risk   06/07/2018 06/05/2017 02/23/2017 10/23/2016 06/27/2016  Falls in the past year? 0 No No No No  Number falls in past yr: 0 - - - -  Injury with Fall? 0 - - - -    Functional Status Survey: Is the patient deaf or have difficulty hearing?: No Does the patient have difficulty seeing, even when wearing glasses/contacts?: No Does the patient have difficulty concentrating, remembering, or making decisions?: No Does the patient have difficulty walking or climbing stairs?: No Does the patient have difficulty dressing or bathing?: No Does the patient have difficulty doing errands alone such as visiting a doctor's office or shopping?: No   Assessment & Plan  1. Preventative health care Patient has been making healthy diet changed, encouraged to continue, recommend increasing physical activity. Labs completed today. CT Chest lung cancer screening ordered by Shawn RN- will send message to confirm    -Prostate cancer screening and PSA options (with potential risks and  benefits of testing vs not testing) were discussed along with recent recs/guidelines. -USPSTF grade A and B recommendations reviewed with patient; age-appropriate recommendations, preventive care, screening tests, etc discussed and encouraged; healthy living encouraged; see AVS for patient education given to patient -Discussed importance of 150 minutes of physical activity weekly, eat two servings of fish weekly, eat one serving of tree nuts ( cashews, pistachios, pecans, almonds.Marland Kitchen) every other day, eat 6 servings of fruit/vegetables daily and drink plenty of water and avoid sweet beverages.  -Reviewed Health Maintenance: discussed shingles vaccine.

## 2018-06-07 NOTE — Telephone Encounter (Signed)
Patient was called to reschedule to [facility] NP's schedule later this morning due to PCP being out of office.  He requested that his labs be ordered for his CPE so that he could come in at his original time for lab draw as he is already fastin, then return for CPE later.  I have reviewed the chart and orders are placed.

## 2018-06-08 ENCOUNTER — Telehealth: Payer: Self-pay

## 2018-06-08 NOTE — Telephone Encounter (Signed)
Call pt regarding lung screening. Left message for pt to return call.  

## 2018-06-10 ENCOUNTER — Telehealth: Payer: Self-pay | Admitting: *Deleted

## 2018-06-10 DIAGNOSIS — Z87891 Personal history of nicotine dependence: Secondary | ICD-10-CM

## 2018-06-10 DIAGNOSIS — Z122 Encounter for screening for malignant neoplasm of respiratory organs: Secondary | ICD-10-CM

## 2018-06-10 LAB — CBC WITH DIFFERENTIAL/PLATELET
Absolute Monocytes: 394 cells/uL (ref 200–950)
Basophils Absolute: 41 cells/uL (ref 0–200)
Basophils Relative: 0.7 %
Eosinophils Absolute: 58 cells/uL (ref 15–500)
Eosinophils Relative: 1 %
HCT: 42.2 % (ref 38.5–50.0)
Hemoglobin: 14.7 g/dL (ref 13.2–17.1)
LYMPHS ABS: 1276 {cells}/uL (ref 850–3900)
MCH: 31.8 pg (ref 27.0–33.0)
MCHC: 34.8 g/dL (ref 32.0–36.0)
MCV: 91.3 fL (ref 80.0–100.0)
MPV: 10.4 fL (ref 7.5–12.5)
Monocytes Relative: 6.8 %
NEUTROS PCT: 69.5 %
Neutro Abs: 4031 cells/uL (ref 1500–7800)
Platelets: 239 10*3/uL (ref 140–400)
RBC: 4.62 10*6/uL (ref 4.20–5.80)
RDW: 12.4 % (ref 11.0–15.0)
Total Lymphocyte: 22 %
WBC: 5.8 10*3/uL (ref 3.8–10.8)

## 2018-06-10 LAB — HEPATITIS C ANTIBODY
Hepatitis C Ab: NONREACTIVE
SIGNAL TO CUT-OFF: 0.07 (ref <1.00)

## 2018-06-10 LAB — COMPLETE METABOLIC PANEL WITH GFR
AG Ratio: 1.6 (calc) (ref 1.0–2.5)
ALT: 23 U/L (ref 9–46)
AST: 20 U/L (ref 10–35)
Albumin: 4.2 g/dL (ref 3.6–5.1)
Alkaline phosphatase (APISO): 35 U/L (ref 35–144)
BUN: 11 mg/dL (ref 7–25)
CO2: 27 mmol/L (ref 20–32)
Calcium: 9 mg/dL (ref 8.6–10.3)
Chloride: 106 mmol/L (ref 98–110)
Creat: 0.94 mg/dL (ref 0.70–1.33)
GFR, Est African American: 103 mL/min/{1.73_m2} (ref >=60)
GFR, Est Non African American: 89 mL/min/{1.73_m2} (ref >=60)
Globulin: 2.6 g/dL (calc) (ref 1.9–3.7)
Glucose, Bld: 121 mg/dL — ABNORMAL HIGH (ref 65–99)
Potassium: 4 mmol/L (ref 3.5–5.3)
Sodium: 141 mmol/L (ref 135–146)
Total Bilirubin: 0.6 mg/dL (ref 0.2–1.2)
Total Protein: 6.8 g/dL (ref 6.1–8.1)

## 2018-06-10 LAB — HEMOGLOBIN A1C
Hgb A1c MFr Bld: 6 % of total Hgb — ABNORMAL HIGH (ref <5.7)
Mean Plasma Glucose: 126 (calc)
eAG (mmol/L): 7 (calc)

## 2018-06-10 LAB — LIPID PANEL
Cholesterol: 177 mg/dL (ref <200)
HDL: 36 mg/dL — ABNORMAL LOW (ref >=40)
LDL Cholesterol (Calc): 116 mg/dL (calc) — ABNORMAL HIGH
Non-HDL Cholesterol (Calc): 141 mg/dL (calc) — ABNORMAL HIGH (ref <130)
TRIGLYCERIDES: 137 mg/dL (ref <150)
Total CHOL/HDL Ratio: 4.9 (calc) (ref <5.0)

## 2018-06-10 LAB — PSA: PSA: 0.6 ng/mL (ref <=4.0)

## 2018-06-10 NOTE — Telephone Encounter (Signed)
Patient has been notified that annual lung cancer screening low dose CT scan is due currently or will be in near future. Confirmed that patient is within the age range of 55-77, and asymptomatic, (no signs or symptoms of lung cancer). Patient denies illness that would prevent curative treatment for lung cancer if found. Verified smoking history, (former, quit 02/25/16, 30 pack year). The shared decision making visit was done 06/13/16. Patient is agreeable for CT scan being scheduled.

## 2018-06-13 ENCOUNTER — Telehealth: Payer: Self-pay | Admitting: *Deleted

## 2018-06-13 NOTE — Telephone Encounter (Signed)
Patient notified/message left to notify patient that due to current restrictions lung screening appointments are cancelled and patient will be contacted regarding rescheduling.  

## 2018-06-20 ENCOUNTER — Ambulatory Visit: Payer: Managed Care, Other (non HMO)

## 2018-08-06 ENCOUNTER — Telehealth: Payer: Self-pay | Admitting: *Deleted

## 2018-08-06 NOTE — Telephone Encounter (Signed)
Left message for patient to notify them that it is time to schedule annual low dose lung cancer screening CT scan. Instructed patient to call back to verify information prior to the scan being scheduled.  

## 2018-08-08 ENCOUNTER — Telehealth: Payer: Self-pay | Admitting: *Deleted

## 2018-08-08 NOTE — Telephone Encounter (Signed)
Patient rescheduled for lung screening scan

## 2018-08-12 ENCOUNTER — Other Ambulatory Visit: Payer: Self-pay | Admitting: *Deleted

## 2018-08-12 DIAGNOSIS — Z87891 Personal history of nicotine dependence: Secondary | ICD-10-CM

## 2018-08-12 DIAGNOSIS — Z122 Encounter for screening for malignant neoplasm of respiratory organs: Secondary | ICD-10-CM

## 2018-08-15 ENCOUNTER — Ambulatory Visit
Admission: RE | Admit: 2018-08-15 | Discharge: 2018-08-15 | Disposition: A | Discharge status: Home / Self Care | Payer: Managed Care, Other (non HMO) | Source: Ambulatory Visit | Attending: Oncology | Admitting: Oncology

## 2018-08-15 ENCOUNTER — Other Ambulatory Visit: Payer: Self-pay

## 2018-08-15 DIAGNOSIS — Z87891 Personal history of nicotine dependence: Secondary | ICD-10-CM | POA: Diagnosis present

## 2018-08-15 DIAGNOSIS — Z122 Encounter for screening for malignant neoplasm of respiratory organs: Secondary | ICD-10-CM | POA: Insufficient documentation

## 2018-08-16 ENCOUNTER — Encounter: Payer: Self-pay | Admitting: *Deleted

## 2018-11-11 ENCOUNTER — Ambulatory Visit (INDEPENDENT_AMBULATORY_CARE_PROVIDER_SITE_OTHER): Payer: Managed Care, Other (non HMO) | Admitting: Family Medicine

## 2018-11-11 ENCOUNTER — Other Ambulatory Visit: Payer: Self-pay

## 2018-11-11 ENCOUNTER — Encounter: Payer: Self-pay | Admitting: Family Medicine

## 2018-11-11 DIAGNOSIS — M25562 Pain in left knee: Secondary | ICD-10-CM

## 2018-11-11 DIAGNOSIS — Z9889 Other specified postprocedural states: Secondary | ICD-10-CM

## 2018-11-11 NOTE — Progress Notes (Signed)
Name: Edward Bean   MRN: [id number]    DOB: 1960-06-02   Date:11/11/2018       Progress Note  Subjective  Chief Complaint  Chief Complaint  Patient presents with  . Knee Pain    left knee pain for 1 month, pror Sx in 1983 referral    I connected with  Lubertha Sayres on 11/11/18 at  1:00 PM EDT by telephone and verified that I am speaking with the correct person using two identifiers.   I discussed the limitations, risks, security and privacy concerns of performing an evaluation and management service by telephone and the availability of in person appointments. Staff also discussed with the patient that there may be a patient responsible charge related to this service. Patient Location: Home Provider Location: Home Additional Individuals present: None  HPI  Pt presents with concern for LEFT knee pain ongoing for about 1 month.  When he was younger he had a tree fall on his left leg from the knee down - it was crush injury with a crush injury to the knee and the tib/fib.  He had repair work done by Dr. Nyra Capes back then.  Has always done fairly well since then, did have a few screws out, but still has some hardware present. Over the last month his left knee pain has been a lot worse, significantly worse over the last 4 days.  He has tried ice, compression, and tried a BC powder but it never helped. He is ambulating independently, but his AROM is decreasing and having trouble getting in and out of the car, no giving out of the knee, no recent injury to the site.  Patient Active Problem List   Diagnosis Date Noted  . Preventative health care 06/05/2017  . Fever blister 02/23/2017  . Medication monitoring encounter 10/31/2016  . Kidney stones 10/23/2016  . Elevated blood pressure reading 10/14/2016  . Aortic atherosclerosis ([place]) 06/27/2016  . Coronary artery calcification seen on CAT scan 06/27/2016  . Pulmonary nodules/lesions, multiple 06/27/2016  . Personal history of tobacco  use, presenting hazards to health 05/31/2016  . Prostate cancer screening 05/31/2016  . Annual physical exam 05/05/2015  . Screening for STD (sexually transmitted disease) 05/05/2015  . Abdominal wall hernia 05/05/2015  . Headache, infrequent episodic tension-type 02/10/2015  . Pre-diabetes 02/10/2015  . Hyperlipidemia with target low density lipoprotein (LDL) cholesterol less than 70 mg/dL 04/02/2013  . Family history of premature CAD 04/02/2013  . Obesity (BMI 30-39.9) 04/02/2013  . History of hypertension     Social History   Tobacco Use  . Smoking status: Former Smoker    Packs/day: 1.00    Years: 30.00    Pack years: 30.00    Types: Cigarettes    Quit date: 02/25/2016    Years since quitting: 2.7  . Smokeless tobacco: Former Systems developer    Quit date: 03/24/2013  . Tobacco comment: Smoked off & on for ~20 yrs prior to the last ~10 yr spell  Substance Use Topics  . Alcohol use: Yes    Alcohol/week: 0.0 standard drinks    Comment: ocassional; usually not bothered by 2-3 beers +/- mixed drinks     Current Outpatient Medications:  .  aspirin EC 81 MG tablet, Take 1 tablet (81 mg total) by mouth daily., Disp: , Rfl:  .  atorvastatin (LIPITOR) 20 MG tablet, Take 1 tablet (20 mg total) by mouth at bedtime., Disp: 30 tablet, Rfl: 1 .  Aspirin-Salicylamide-Caffeine (BC HEADACHE POWDER  PO), Take by mouth as needed., Disp: , Rfl:   Allergies  Allergen Reactions  . Shrimp [Shellfish Allergy]     Itching and rash    I personally reviewed active problem list, medication list, allergies, notes from last encounter, lab results with the patient/caregiver today.  ROS  Constitutional: Negative for fever or weight change.  Respiratory: Negative for cough and shortness of breath.   Cardiovascular: Negative for chest pain or palpitations.  Gastrointestinal: Negative for abdominal pain, no bowel changes.  Musculoskeletal: Negative for gait problem or joint swelling.  Skin: Negative for  rash.  Neurological: Negative for dizziness or headache.  No other specific complaints in a complete review of systems (except as listed in HPI above).  Objective  Virtual encounter, vitals not obtained.  There is no height or weight on file to calculate BMI.  Nursing Note and Vital Signs reviewed.  Physical Exam  Pulmonary/Chest: Effort normal. No respiratory distress. Speaking in complete sentences Neurological: Pt is alert and oriented to person, place, and time. Speech is normal. Psychiatric: Patient has a normal mood and affect. behavior is normal. Judgment and thought content normal.  No results found for this or any previous visit (from the past 72 hour(s)).  Assessment & Plan  1. Acute pain of left knee - Ambulatory referral to Orthopedics - He declines NSAID at this time; will call back if he changes his mind.  2. S/P left knee surgery - Ambulatory referral to Orthopedics  -Red flags and when to present for emergency care or RTC including fever >101.76F, chest pain, shortness of breath, new/worsening/un-resolving symptoms,  reviewed with patient at time of visit. Follow up and care instructions discussed and provided in AVS. - I discussed the assessment and treatment plan with the patient. The patient was provided an opportunity to ask questions and all were answered. The patient agreed with the plan and demonstrated an understanding of the instructions.  - The patient was advised to call back or seek an in-person evaluation if the symptoms worsen or if the condition fails to improve as anticipated.  I provided 12 minutes of non-face-to-face time during this encounter.  Hubbard Hartshorn, FNP

## 2018-11-11 NOTE — Patient Instructions (Signed)
[facility] open Monday - Friday 1p-7p

## 2019-06-13 ENCOUNTER — Encounter: Payer: Managed Care, Other (non HMO) | Admitting: Family Medicine

## 2019-06-25 ENCOUNTER — Encounter: Payer: Self-pay | Admitting: Internal Medicine

## 2019-06-25 ENCOUNTER — Other Ambulatory Visit: Payer: Self-pay

## 2019-06-25 ENCOUNTER — Ambulatory Visit (INDEPENDENT_AMBULATORY_CARE_PROVIDER_SITE_OTHER): Payer: Managed Care, Other (non HMO) | Admitting: Internal Medicine

## 2019-06-25 VITALS — BP 150/98 | HR 90 | Ht 71.0 in | Wt 257.5 lb

## 2019-06-25 DIAGNOSIS — E785 Hyperlipidemia, unspecified: Secondary | ICD-10-CM | POA: Diagnosis not present

## 2019-06-25 DIAGNOSIS — I2584 Coronary atherosclerosis due to calcified coronary lesion: Secondary | ICD-10-CM

## 2019-06-25 DIAGNOSIS — I2510 Atherosclerotic heart disease of native coronary artery without angina pectoris: Secondary | ICD-10-CM | POA: Diagnosis not present

## 2019-06-25 DIAGNOSIS — I10 Essential (primary) hypertension: Secondary | ICD-10-CM | POA: Diagnosis not present

## 2019-06-25 NOTE — Progress Notes (Signed)
Follow-up Outpatient Visit Date: 06/25/2019  Primary Care Provider: Arnetha Courser, MD No address on file  Chief Complaint: Follow-up coronary artery calcification  HPI:  Edward Bean is a 59 y.o. male with history of hypertension, hyperlipidemia, coronary artery calcification, sleep apnea, and obesity, who presents for follow-up of coronary artery calcification.  He was previously followed in our practice by Dr. Ellyn Hack, having last been seen in 09/2016.  He was doing well at that time.  He originally saw Dr. Ellyn Hack for evaluation of syncope at least 5-6 years ago, which was ultimately attributed to dehydration.  Today, Edward Bean is doing well and wants to reestablish care for risk factor modification.  He has put on about 20 pounds over the last year, which he attributes to being less active in the setting of COVID-19.  He has started exercising and working on his diet again in an effort to lose weight.  He notes some arthritis in his knees, though he is still able to exercise.  He denies chest pain, shortness of breath, lightheadedness, and edema.  He has brief, sporadic palpitations lasting a second or two without associated symptoms.  --------------------------------------------------------------------------------------------------  Past Medical History:  Diagnosis Date  . Aortic atherosclerosis ([place]) 06/27/2016   Chest CT march 2018  . Asthma    as child  . Coronary artery disease 06/27/2016  . Family history of premature CAD 04/02/2013  . Hx of tobacco use, presenting hazards to health 05/31/2016   Quit Dec 2017  . Hyperlipidemia LDL goal <130 04/02/2013  . Hypertension, essential   . Impaired glucose tolerance in obese   . Kidney stone   . Metabolic syndrome    Obese; HLD (TG 243), Low HDL (34), HTN  . Obesity (BMI 30-39.9) 04/02/2013  . OSA (obstructive sleep apnea)    Not currently on CPAP; clinical diagnosis by PCP  . Pulmonary nodules/lesions, multiple 06/27/2016   Chest CT 2018    . Sleep apnea   . Smoking greater than 10 pack years 04/02/2013  . Syncope    Past Surgical History:  Procedure Laterality Date  . GXT/ETT  08/2016   Normal LV size. Moderate LVH. EF 60-65%. GR 1 DD. Indeterminate LV filling pressures (less than last echo). Mild aortic stenosis (peak gradient 24 mmHg. MAC. Mild biatrial enlargement.    Marland Kitchen KNEE SURGERY     Extensive L Knee surgery  . Treadmill Myoview  03/2013   Exercise 10 min; 12 METS (excellent exercise tolerance)) - read 169 bpm (101% of max predicted) - no ECG or scintigraphic evidence of ischemia or infarction    Current Meds  Medication Sig  . Aspirin-Salicylamide-Caffeine (BC HEADACHE POWDER PO) Take by mouth as needed.    Allergies: Shrimp [shellfish allergy]  Social History   Tobacco Use  . Smoking status: Former Smoker    Packs/day: 1.00    Years: 30.00    Pack years: 30.00    Types: Cigarettes    Quit date: 02/25/2016    Years since quitting: 3.3  . Smokeless tobacco: Former Systems developer    Quit date: 03/24/2013  . Tobacco comment: Smoked off & on for ~20 yrs prior to the last ~10 yr spell  Substance Use Topics  . Alcohol use: Yes    Alcohol/week: 0.0 standard drinks    Comment: Occasional, when on vacation  . Drug use: Not Currently    Comment: marijuana many years ago    Family History  Problem Relation Age of Onset  . Esophageal  cancer Mother   . Heart attack Mother 76  . Hypertension Mother   . Bladder Cancer Mother   . Cancer Mother   . Lung cancer Father        Long term smoker  . Heart attack Father 7       MI -> ~12 yrs later --> CABG  . Hyperlipidemia Father   . Kidney disease Neg Hx   . Prostate cancer Neg Hx     Review of Systems: A 12-system review of systems was performed and was negative except as noted in the HPI.  --------------------------------------------------------------------------------------------------  Physical Exam: BP (!) 150/98 (BP Location: Right Arm, Patient Position:  Sitting, Cuff Size: Large)   Pulse 90   Ht _0  (1.803 m)   Wt 257 lb 8 oz (116.8 kg)   SpO2 96%   BMI 35.91 kg/m   General:  NAD Neck: No JVD or HJR.  No carotid bruit. Lungs: CTA bilaterally without wheezes or crackles. Heart: RRR without murmurs, rubs or gallops. Abdomen: Soft, NT/ND. Extremities: No LE edema.  2+ radial and pedal pulses bilaterally.  EKG:  Normal sinus rhythm with left axis deviation and non-specific T wave changes.  Lab Results  Component Value Date   WBC 5.8 06/07/2018   HGB 14.7 06/07/2018   HCT 42.2 06/07/2018   MCV 91.3 06/07/2018   PLT 239 06/07/2018    Lab Results  Component Value Date   NA 141 06/07/2018   K 4.0 06/07/2018   CL 106 06/07/2018   CO2 27 06/07/2018   BUN 11 06/07/2018   CREATININE 0.94 06/07/2018   GLUCOSE 121 (H) 06/07/2018   ALT 23 06/07/2018    Lab Results  Component Value Date   CHOL 177 06/07/2018   HDL 36 (L) 06/07/2018   LDLCALC 116 (H) 06/07/2018   TRIG 137 06/07/2018   CHOLHDL 4.9 06/07/2018    --------------------------------------------------------------------------------------------------  ASSESSMENT AND PLAN: Coronary artery calcification and dyslipidemia: Incidentally noted on prior lung cancer screening CT's.  I have personally reviewed the images, which are notable for a small focus of calcification in the region of the distal LMCA.  Overall calcium burden appears to be fairly low.  Functional testing in 2015 and 2018 was low risk without evidence of ischemia.  Given lack of symptoms, we have agreed to defer additional testing at this time.  We discuss the implications of coronary artery calcification as a marker of atherosclerotic cardiovascular disease.  I asked him to consider addition of a statin (previously prescribed, though Edward Bean never filled the prescription).  He would like to work on lifestyle modifications first, which I think is reasonable, given that his most recent LDL was only mildly  elevated at 116.  We will plan to recheck a fasting lipid panel and LFT's in ~6 months.  If LDL remains > 100, we will need to reconsider addition of a statin.  Hypertension: Blood pressure suboptimally controlled today.  We have agreed to work on sodium restriction, weight loss, and exercise.  Pharmacotherapy will need to be considered if blood pressure remains above 140/90 at follow-up.  Edward Bean was given information on the DASH diet.  Follow-up: Return to clinic in 6 months.  Nelva Bush, MD 06/26/2019 8:40 PM

## 2019-06-25 NOTE — Patient Instructions (Signed)
Handout on DASH diet  Medication Instructions:  Your physician recommends that you continue on your current medications as directed. Please refer to the Current Medication list given to you today.  *If you need a refill on your cardiac medications before your next appointment, please call your pharmacy*   Lab Work: Your physician recommends that you return for lab work in: 6 months around September 30th, 2021. - check LIPID, CMP. - You will need to be fasting. Please do not have anything to eat or drink after midnight the morning you have the lab work. You may only have water or black coffee with no cream or sugar. - Please go to the [facility]. You will check in at the front desk to the right as you walk into the atrium. Valet Parking is offered if needed. - No appointment needed. You may go any day between 7 am and 6 pm.  If you have labs (blood work) drawn today and your tests are completely normal, you will receive your results only by: Marland Kitchen MyChart Message (if you have MyChart) OR . A paper copy in the mail If you have any lab test that is abnormal or we need to change your treatment, we will call you to review the results.   Testing/Procedures: none   Follow-Up: At [facility], you and your health needs are our priority.  As part of our continuing mission to provide you with exceptional heart care, we have created designated Provider Care Teams.  These Care Teams include your primary Cardiologist (physician) and Advanced Practice Providers (APPs -  Physician Assistants and Nurse Practitioners) who all work together to provide you with the care you need, when you need it.  We recommend signing up for the patient portal called "MyChart".  Sign up information is provided on this After Visit Summary.  MyChart is used to connect with patients for Virtual Visits (Telemedicine).  Patients are able to view lab/test results, encounter notes, upcoming appointments, etc.  Non-urgent  messages can be sent to your provider as well.   To learn more about what you can do with MyChart, go to [email or web address].    Your next appointment:   6 month(s)  The format for your next appointment:   In Person  Provider:    You may see DR Harrell Gave END or one of the following Advanced Practice Providers on your designated Care Team:    Murray Hodgkins, NP  Christell Faith, PA-C  Marrianne Mood, PA-C

## 2019-06-26 ENCOUNTER — Encounter: Payer: Self-pay | Admitting: Internal Medicine

## 2019-07-31 ENCOUNTER — Telehealth: Payer: Self-pay

## 2019-07-31 NOTE — Telephone Encounter (Signed)
I contacted patient today to attempt to schedule annual lung screening CT. Patient's voice mail at [phone] is full and I was unable to leave a message.  The other number listed [phone] says no longer in service.

## 2019-08-07 ENCOUNTER — Telehealth: Payer: Self-pay

## 2019-08-07 NOTE — Telephone Encounter (Signed)
Unsuccessful attempt at calling patient to notify them that it is time to schedule the low dose lung cancer screening CT scan. 

## 2019-09-09 ENCOUNTER — Telehealth: Payer: Self-pay | Admitting: *Deleted

## 2019-09-09 DIAGNOSIS — Z87891 Personal history of nicotine dependence: Secondary | ICD-10-CM

## 2019-09-09 DIAGNOSIS — Z122 Encounter for screening for malignant neoplasm of respiratory organs: Secondary | ICD-10-CM

## 2019-09-09 NOTE — Telephone Encounter (Signed)
(  09/09/19) Pt has been notified that lung cancer screening CT scan is due currently or will be in near future. Confirmed pt is within appropriate age range, and asymptomatic. Pt denies illness that would prevent curative treatment for lung cancer if found. Verified smoking history (Former Smoker since 2017, 1 ppd). Pt is agreeable for CT scan being scheduled. No day or time preference SRW

## 2019-09-11 NOTE — Telephone Encounter (Signed)
Smoking history: former, quit 02/25/16, 30 pack year

## 2019-09-11 NOTE — Addendum Note (Signed)
Addended by: Lieutenant Diego on: 09/11/2019 11:04 AM   Modules accepted: Orders

## 2019-09-22 ENCOUNTER — Ambulatory Visit
Admission: RE | Admit: 2019-09-22 | Discharge: 2019-09-22 | Disposition: A | Discharge status: Home / Self Care | Payer: Managed Care, Other (non HMO) | Source: Ambulatory Visit | Attending: Oncology | Admitting: Oncology

## 2019-09-22 ENCOUNTER — Other Ambulatory Visit: Payer: Self-pay

## 2019-09-22 DIAGNOSIS — Z87891 Personal history of nicotine dependence: Secondary | ICD-10-CM | POA: Insufficient documentation

## 2019-09-22 DIAGNOSIS — Z122 Encounter for screening for malignant neoplasm of respiratory organs: Secondary | ICD-10-CM | POA: Diagnosis present

## 2019-09-25 ENCOUNTER — Encounter: Payer: Self-pay | Admitting: *Deleted

## 2019-12-24 ENCOUNTER — Ambulatory Visit: Payer: Managed Care, Other (non HMO) | Admitting: Internal Medicine

## 2019-12-24 NOTE — Progress Notes (Deleted)
Follow-up Outpatient Visit Date: 12/24/2019  Primary Care Provider: Arnetha Courser, MD No address on file  Chief Complaint: ***  HPI:  Mr. Czaja is a 59 y.o. male with history of hypertension, hyperlipidemia, coronary artery calcification, sleep apnea, and obesity, who presents for follow-up of coronary artery calcification.  I last saw him in March, at which time Mr. Sakuma wanted to reestablish care to assist with risk factor modification.  Other than arthritic pain in his knees and rare self-limited palpitations, he was asymptomatic.  We deferred additional testing.  I encouraged addition of a statin, but Mr. Gypsy Balsam wished to work on lifestyle modification first.  Blood pressure was also modestly elevated; we also discussed lifestyle modifications to help lower this.  --------------------------------------------------------------------------------------------------  Past Medical History:  Diagnosis Date  . Aortic atherosclerosis ([place]) 06/27/2016   Chest CT march 2018  . Asthma    as child  . Coronary artery disease 06/27/2016  . Family history of premature CAD 04/02/2013  . Hx of tobacco use, presenting hazards to health 05/31/2016   Quit Dec 2017  . Hyperlipidemia LDL goal <130 04/02/2013  . Hypertension, essential   . Impaired glucose tolerance in obese   . Kidney stone   . Metabolic syndrome    Obese; HLD (TG 243), Low HDL (34), HTN  . Obesity (BMI 30-39.9) 04/02/2013  . OSA (obstructive sleep apnea)    Not currently on CPAP; clinical diagnosis by PCP  . Pulmonary nodules/lesions, multiple 06/27/2016   Chest CT 2018  . Sleep apnea   . Smoking greater than 10 pack years 04/02/2013  . Syncope    Past Surgical History:  Procedure Laterality Date  . GXT/ETT  08/2016   Normal LV size. Moderate LVH. EF 60-65%. GR 1 DD. Indeterminate LV filling pressures (less than last echo). Mild aortic stenosis (peak gradient 24 mmHg. MAC. Mild biatrial enlargement.    Marland Kitchen KNEE SURGERY     Extensive L  Knee surgery  . Treadmill Myoview  03/2013   Exercise 10 min; 12 METS (excellent exercise tolerance)) - read 169 bpm (101% of max predicted) - no ECG or scintigraphic evidence of ischemia or infarction    No outpatient medications have been marked as taking for the 12/24/19 encounter (Appointment) with Oluwaseyi Tull, Harrell Gave, MD.    Allergies: Shrimp [shellfish allergy]  Social History   Tobacco Use  . Smoking status: Former Smoker    Packs/day: 1.00    Years: 30.00    Pack years: 30.00    Types: Cigarettes    Quit date: 02/25/2016    Years since quitting: 3.8  . Smokeless tobacco: Former Systems developer    Quit date: 03/24/2013  . Tobacco comment: Smoked off & on for ~20 yrs prior to the last ~10 yr spell  Vaping Use  . Vaping Use: Never used  Substance Use Topics  . Alcohol use: Yes    Alcohol/week: 0.0 standard drinks    Comment: Occasional, when on vacation  . Drug use: Not Currently    Comment: marijuana many years ago    Family History  Problem Relation Age of Onset  . Esophageal cancer Mother   . Heart attack Mother 62  . Hypertension Mother   . Bladder Cancer Mother   . Cancer Mother   . Lung cancer Father        Long term smoker  . Heart attack Father 46       MI -> ~12 yrs later --> CABG  . Hyperlipidemia Father   .  Kidney disease Neg Hx   . Prostate cancer Neg Hx     Review of Systems: A 12-system review of systems was performed and was negative except as noted in the HPI.  --------------------------------------------------------------------------------------------------  Physical Exam: There were no vitals taken for this visit.  General:  *** HEENT: No conjunctival pallor or scleral icterus. Facemask in place. Neck: Supple without lymphadenopathy, thyromegaly, JVD, or HJR. Lungs: Normal work of breathing. Clear to auscultation bilaterally without wheezes or crackles. Heart: Regular rate and rhythm without murmurs, rubs, or gallops. Non-displaced PMI. Abd: Bowel  sounds present. Soft, NT/ND without hepatosplenomegaly Ext: No lower extremity edema. Radial, PT, and DP pulses are 2+ bilaterally. Skin: Warm and dry without rash.  EKG:  ***  Lab Results  Component Value Date   WBC 5.8 06/07/2018   HGB 14.7 06/07/2018   HCT 42.2 06/07/2018   MCV 91.3 06/07/2018   PLT 239 06/07/2018    Lab Results  Component Value Date   NA 141 06/07/2018   K 4.0 06/07/2018   CL 106 06/07/2018   CO2 27 06/07/2018   BUN 11 06/07/2018   CREATININE 0.94 06/07/2018   GLUCOSE 121 (H) 06/07/2018   ALT 23 06/07/2018    Lab Results  Component Value Date   CHOL 177 06/07/2018   HDL 36 (L) 06/07/2018   LDLCALC 116 (H) 06/07/2018   TRIG 137 06/07/2018   CHOLHDL 4.9 06/07/2018    --------------------------------------------------------------------------------------------------  ASSESSMENT AND PLAN: Harrell Gave Russ Looper, MD 12/24/2019 7:27 AM

## 2019-12-26 ENCOUNTER — Encounter: Payer: Self-pay | Admitting: Internal Medicine

## 2021-04-07 ENCOUNTER — Other Ambulatory Visit: Payer: Self-pay | Admitting: *Deleted

## 2021-04-07 DIAGNOSIS — Z87891 Personal history of nicotine dependence: Secondary | ICD-10-CM

## 2021-04-21 ENCOUNTER — Ambulatory Visit
Admission: RE | Admit: 2021-04-21 | Discharge: 2021-04-21 | Disposition: A | Discharge status: Home / Self Care | Payer: Managed Care, Other (non HMO) | Source: Ambulatory Visit | Attending: Acute Care | Admitting: Acute Care

## 2021-04-21 ENCOUNTER — Other Ambulatory Visit: Payer: Self-pay

## 2021-04-21 DIAGNOSIS — R918 Other nonspecific abnormal finding of lung field: Secondary | ICD-10-CM | POA: Diagnosis not present

## 2021-04-21 DIAGNOSIS — Z87891 Personal history of nicotine dependence: Secondary | ICD-10-CM | POA: Insufficient documentation

## 2021-04-21 DIAGNOSIS — J432 Centrilobular emphysema: Secondary | ICD-10-CM | POA: Diagnosis not present

## 2021-04-25 ENCOUNTER — Other Ambulatory Visit: Payer: Self-pay | Admitting: Acute Care

## 2021-04-25 DIAGNOSIS — Z87891 Personal history of nicotine dependence: Secondary | ICD-10-CM

## 2021-07-08 ENCOUNTER — Telehealth: Payer: Self-pay

## 2021-07-08 NOTE — Telephone Encounter (Signed)
Wife notified.

## 2021-07-08 NOTE — Telephone Encounter (Signed)
Copied from [place] [id number]. Topic: General - Other >> Jul 08, 2021 11:05 AM Yvette Rack wrote: Reason for CRM: Pt spouse stated pt can have labs drawn for free at [facility] but they need the doctor to place an oder for the labs. Pt spouse requests that [facility] be contacted at ph# [phone]. Pt spouse also requests call back to advise once order has been placed because there are a few other steps that they will have to complete.Cb# [phone]

## 2021-07-11 ENCOUNTER — Encounter: Payer: Self-pay | Admitting: Internal Medicine

## 2021-07-11 ENCOUNTER — Ambulatory Visit (INDEPENDENT_AMBULATORY_CARE_PROVIDER_SITE_OTHER): Payer: Managed Care, Other (non HMO) | Admitting: Internal Medicine

## 2021-07-11 VITALS — BP 150/86 | HR 87 | Temp 98.5°F | Resp 16 | Ht 70.0 in | Wt 256.4 lb

## 2021-07-11 DIAGNOSIS — Z1211 Encounter for screening for malignant neoplasm of colon: Secondary | ICD-10-CM

## 2021-07-11 DIAGNOSIS — Z125 Encounter for screening for malignant neoplasm of prostate: Secondary | ICD-10-CM

## 2021-07-11 DIAGNOSIS — Z0000 Encounter for general adult medical examination without abnormal findings: Secondary | ICD-10-CM

## 2021-07-11 DIAGNOSIS — Z13220 Encounter for screening for lipoid disorders: Secondary | ICD-10-CM

## 2021-07-11 DIAGNOSIS — R7309 Other abnormal glucose: Secondary | ICD-10-CM | POA: Diagnosis not present

## 2021-07-11 NOTE — Patient Instructions (Addendum)
It was great seeing you today! ? ?Plan discussed at today's visit: ?-Blood work ordered today, results will be uploaded to [place].  ?-Referral to GI for colonoscopy  ?-Start monitoring blood pressure at home, write this down and bring to next appointment  ? ?Follow up in: 1 month  ? ?Take care and let us know if you have any questions or concerns prior to your next visit. ? ?Dr. Rosana Berger ? ? ?Recombinant Zoster (Shingles) Vaccine: What You Need to Know ?1. Why get vaccinated? ?Recombinant zoster (shingles) vaccine can prevent shingles. ?Shingles (also called herpes zoster, or just zoster) is a painful skin rash, usually with blisters. In addition to the rash, shingles can cause fever, headache, chills, or upset stomach. Rarely, shingles can lead to complications such as pneumonia, hearing problems, blindness, brain inflammation (encephalitis), or death. ?The risk of shingles increases with age. The most common complication of shingles is long-term nerve pain called postherpetic neuralgia (PHN). PHN occurs in the areas where the shingles rash was and can last for months or years after the rash goes away. The pain from PHN can be severe and debilitating. ?The risk of PHN increases with age. An older adult with shingles is more likely to develop PHN and have longer lasting and more severe pain than a younger person. ?People with weakened immune systems also have a higher risk of getting shingles and complications from the disease. ?Shingles is caused by varicella-zoster virus, the same virus that causes chickenpox. After you have chickenpox, the virus stays in your body and can cause shingles later in life. Shingles cannot be passed from one person to another, but the virus that causes shingles can spread and cause chickenpox in someone who has never had chickenpox or has never received chickenpox vaccine. ?2. Recombinant shingles vaccine ?Recombinant shingles vaccine provides strong protection against shingles. By  preventing shingles, recombinant shingles vaccine also protects against PHN and other complications. ?Recombinant shingles vaccine is recommended for: ?Adults 50 years and older ?Adults 19 years and older who have a weakened immune system because of disease or treatments ?Shingles vaccine is given as a two-dose series. For most people, the second dose should be given 2 to 6 months after the first dose. Some people who have or will have a weakened immune system can get the second dose 1 to 2 months after the first dose. Ask your health care provider for guidance. ?People who have had shingles in the past and people who have received varicella (chickenpox) vaccine are recommended to get recombinant shingles vaccine. The vaccine is also recommended for people who have already gotten another type of shingles vaccine, the live shingles vaccine. There is no live virus in recombinant shingles vaccine. ?Shingles vaccine may be given at the same time as other vaccines. ?3. Talk with your health care provider ?Tell your vaccination provider if the person getting the vaccine: ?Has had an allergic reaction after a previous dose of recombinant shingles vaccine, or has any severe, life-threatening allergies ?Is currently experiencing an episode of shingles ?Is pregnant ?In some cases, your health care provider may decide to postpone shingles vaccination until a future visit. ?People with minor illnesses, such as a cold, may be vaccinated. People who are moderately or severely ill should usually wait until they recover before getting recombinant shingles vaccine. ?Your health care provider can give you more information. ?4. Risks of a vaccine reaction ?A sore arm with mild or moderate pain is very common after recombinant shingles vaccine. Redness and  swelling can also happen at the site of the injection. ?Tiredness, muscle pain, headache, shivering, fever, stomach pain, and nausea are common after recombinant shingles  vaccine. ?These side effects may temporarily prevent a vaccinated person from doing regular activities. Symptoms usually go away on their own in 2 to 3 days. You should still get the second dose of recombinant shingles vaccine even if you had one of these reactions after the first dose. ?Guillain-Barr? syndrome (GBS), a serious nervous system disorder, has been reported very rarely after recombinant zoster vaccine. ?People sometimes faint after medical procedures, including vaccination. Tell your provider if you feel dizzy or have vision changes or ringing in the ears. ?As with any medicine, there is a very remote chance of a vaccine causing a severe allergic reaction, other serious injury, or death. ?5. What if there is a serious problem? ?An allergic reaction could occur after the vaccinated person leaves the clinic. If you see signs of a severe allergic reaction (hives, swelling of the face and throat, difficulty breathing, a fast heartbeat, dizziness, or weakness), call 9-1-1 and get the person to the nearest hospital. ?For other signs that concern you, call your health care provider. ?Adverse reactions should be reported to the Vaccine Adverse Event Reporting System (VAERS). Your health care provider will usually file this report, or you can do it yourself. Visit the VAERS website at www.vaers.SamedayNews.es or call 636-708-4578. VAERS is only for reporting reactions, and VAERS staff members do not give medical advice. ?6. How can I learn more? ?Ask your health care provider. ?Call your local or state health department. ?Visit the website of the Food and Drug Administration (FDA) for vaccine package inserts and additional information at http://lopez-wang.org/. ?Contact the Centers for Disease Control and Prevention (CDC): ?Call 905-572-4414 (1-800-CDC-INFO) or ?Visit CDC's website at http://hunter.com/. ?Source: CDC Vaccine Information Statement Recombinant Zoster Vaccine (04/30/2020) ?This  same material is available at http://www.wolf.info/ for no charge. ?This information is not intended to replace advice given to you by your health care provider. Make sure you discuss any questions you have with your health care provider. ?Document Revised: 02/09/2021 Document Reviewed: 05/14/2020 ?Elsevier Patient Education ? [place]. ?Health Maintenance, Male ?Adopting a healthy lifestyle and getting preventive care are important in promoting health and wellness. Ask your health care provider about: ?The right schedule for you to have regular tests and exams. ?Things you can do on your own to prevent diseases and keep yourself healthy. ?What should I know about diet, weight, and exercise? ?Eat a healthy diet ? ?Eat a diet that includes plenty of vegetables, fruits, low-fat dairy products, and lean protein. ?Do not eat a lot of foods that are high in solid fats, added sugars, or sodium. ?Maintain a healthy weight ?Body mass index (BMI) is a measurement that can be used to identify possible weight problems. It estimates body fat based on height and weight. Your health care provider can help determine your BMI and help you achieve or maintain a healthy weight. ?Get regular exercise ?Get regular exercise. This is one of the most important things you can do for your health. Most adults should: ?Exercise for at least 150 minutes each week. The exercise should increase your heart rate and make you sweat (moderate-intensity exercise). ?Do strengthening exercises at least twice a week. This is in addition to the moderate-intensity exercise. ?Spend less time sitting. Even light physical activity can be beneficial. ?Watch cholesterol and blood lipids ?Have your blood tested for lipids and cholesterol at  62 years of age, then have this test every 5 years. ?You may need to have your cholesterol levels checked more often if: ?Your lipid or cholesterol levels are high. ?You are older than 62 years of age. ?You are at high risk  for heart disease. ?What should I know about cancer screening? ?Many types of cancers can be detected early and may often be prevented. Depending on your health history and family history, you may need to have cance

## 2021-07-11 NOTE — Progress Notes (Signed)
Name: Edward Bean   MRN: [id number]    DOB: 1961/02/10   Date:07/11/2021 ? ?     Progress Note ? ?Subjective ? ?Chief Complaint ? ?Chief Complaint  ?Patient presents with  ? Annual Exam  ? ? ?HPI ? ?Patient presents for annual CPE. ? ?IPSS Questionnaire (AUA-7): ?Over the past month?   ?1)  How often have you had a sensation of not emptying your bladder completely after you finish urinating?  0 - Not at all  ?2)  How often have you had to urinate again less than two hours after you finished urinating? 1 - Less than 1 time in 5  ?3)  How often have you found you stopped and started again several times when you urinated?  0 - Not at all  ?4) How difficult have you found it to postpone urination?  2 - Less than half the time  ?5) How often have you had a weak urinary stream?  0 - Not at all  ?6) How often have you had to push or strain to begin urination?  0 - Not at all  ?7) How many times did you most typically get up to urinate from the time you went to bed until the time you got up in the morning?  1 - 1 time  ?Total score:  0-7 mildly symptomatic  ? 8-19 moderately symptomatic  ? 20-35 severely symptomatic  ?  ? ?Diet: feels diet is well rounded, starting to eat smaller portions, decreasing breads and carbs in the diet. Trying to stay well hydrated. Likes red meant, does eat chicken. Has cut out going to [place] for biscuits in the morning.  ?Exercise: not regularly but is active  ? ?Depression: phq 9 is negative ? ?  07/11/2021  ?  8:19 AM 11/11/2018  ? 12:30 PM 06/07/2018  ?  9:03 AM 06/05/2017  ?  8:29 AM 02/23/2017  ?  8:46 AM  ?Depression screen PHQ 2/9  ?Decreased Interest 0 0 0 0 0  ?Down, Depressed, Hopeless 0 0 0 0 0  ?PHQ - 2 Score 0 0 0 0 0  ?Altered sleeping 0 0 0    ?Tired, decreased energy 0 0 0    ?Change in appetite 0 0 0    ?Feeling bad or failure about yourself  0 0 0    ?Trouble concentrating 0 0 0    ?Moving slowly or fidgety/restless 0 0 0    ?Suicidal thoughts 0 0 0    ?PHQ-9 Score 0 0 0     ?Difficult doing work/chores Not difficult at all Not difficult at all Not difficult at all    ? ? ?Hypertension:  ?BP Readings from Last 3 Encounters:  ?06/25/19 (!) 150/98  ?06/07/18 122/86  ?06/05/17 122/78  ? ? ?Obesity: ?Wt Readings from Last 3 Encounters:  ?04/21/21 250 lb (113.4 kg)  ?09/22/19 250 lb (113.4 kg)  ?06/25/19 257 lb 8 oz (116.8 kg)  ? ?BMI Readings from Last 3 Encounters:  ?04/21/21 35.87 kg/m?  ?09/22/19 34.87 kg/m?  ?06/25/19 35.91 kg/m?  ?  ? ?Lipids:  ?Lab Results  ?Component Value Date  ? CHOL 177 06/07/2018  ? CHOL 193 06/05/2017  ? CHOL 190 10/23/2016  ? ?Lab Results  ?Component Value Date  ? HDL 36 (L) 06/07/2018  ? HDL 38 (L) 06/05/2017  ? HDL 37 (L) 10/23/2016  ? ?Lab Results  ?Component Value Date  ? LDLCALC 116 (H) 06/07/2018  ? [place] 133 (H)  06/05/2017  ? LDLCALC 124 (H) 10/23/2016  ? ?Lab Results  ?Component Value Date  ? TRIG 137 06/07/2018  ? TRIG 114 06/05/2017  ? TRIG 146 10/23/2016  ? ?Lab Results  ?Component Value Date  ? CHOLHDL 4.9 06/07/2018  ? CHOLHDL 5.1 (H) 06/05/2017  ? CHOLHDL 5.1 (H) 10/23/2016  ? ?No results found for: LDLDIRECT ?Glucose:  ?Glucose, Bld  ?Date Value Ref Range Status  ?06/07/2018 121 (H) 65 - 99 mg/dL Final  ?  Comment:  ?  . ?           Fasting reference interval ?. ?For someone without known diabetes, a glucose value ?between 100 and 125 mg/dL is consistent with ?prediabetes and should be confirmed with a ?follow-up test. ?. ?  ?06/05/2017 102 (H) 65 - 99 mg/dL Final  ?  Comment:  ?  . ?           Fasting reference interval ?. ?For someone without known diabetes, a glucose value ?between 100 and 125 mg/dL is consistent with ?prediabetes and should be confirmed with a ?follow-up test. ?. ?  ?10/23/2016 104 (H) 65 - 99 mg/dL Final  ? ? ?[place] Office Visit from 11/11/2018 [facility]  ?AUDIT-C Score 0  ? ?  ? ? ?Married ?STD testing and prevention (HIV/chl/gon/syphilis):  no ?Sexual history: married, no concerns  ?Hep C  Screening: 05/2018 ?Skin cancer: Discussed monitoring for atypical lesions ?Colorectal cancer: Due ?Prostate cancer screening:  no, PSA due ?Lab Results  ?Component Value Date  ? PSA 0.6 06/07/2018  ? PSA 0.4 06/05/2017  ? PSA 0.5 05/31/2016  ? ? ?Lung cancer:  Low Dose CT Chest recommended if Age 71-80 years, 30 pack-year currently smoking OR have quit w/in 15years. Patient  yes a candidate for screening   ?1/23 - LUNG-RADs 2  ?AAA: The USPSTF recommends one-time screening with ultrasonography in men ages 57 to 68 years who have ever smoked. Patient   not applicable, a candidate for screening  ?ECG:  3/21 ? ?Vaccines:  ? ?HPV: n/a ?Tdap: 03/2013 ?Shingrix: Due ?Pneumonia: Will be due at age 67  ?Flu: Due in the fall  ?COVID-19: never had ? ?Advanced Care Planning: A voluntary discussion about advance care planning including the explanation and discussion of advance directives.  Discussed health care proxy and Living will, and the patient was able to identify a health care proxy as Edward Bean (wife).  Patient does not have a living will and power of attorney of health care  ? ?Patient Active Problem List  ? Diagnosis Date Noted  ? Fever blister 02/23/2017  ? Medication monitoring encounter 10/31/2016  ? Kidney stones 10/23/2016  ? Elevated blood pressure reading 10/14/2016  ? Aortic atherosclerosis ([place]) 06/27/2016  ? Coronary artery calcification seen on CAT scan 06/27/2016  ? Pulmonary nodules/lesions, multiple 06/27/2016  ? Personal history of tobacco use, presenting hazards to health 05/31/2016  ? Prostate cancer screening 05/31/2016  ? Screening for STD (sexually transmitted disease) 05/05/2015  ? Abdominal wall hernia 05/05/2015  ? Headache, infrequent episodic tension-type 02/10/2015  ? Pre-diabetes 02/10/2015  ? Dyslipidemia 04/02/2013  ? Family history of premature CAD 04/02/2013  ? Obesity (BMI 30-39.9) 04/02/2013  ? History of hypertension   ? ? ?Past Surgical History:  ?Procedure Laterality Date  ?  GXT/ETT  08/2016  ? Normal LV size. Moderate LVH. EF 60-65%. GR 1 DD. Indeterminate LV filling pressures (less than last echo). Mild aortic stenosis (peak gradient 24  mmHg. MAC. Mild biatrial enlargement.    ? KNEE SURGERY    ? Extensive L Knee surgery  ? Treadmill Myoview  03/2013  ? Exercise 10 min; 12 METS (excellent exercise tolerance)) - read 169 bpm (101% of max predicted) - no ECG or scintigraphic evidence of ischemia or infarction  ? ? ?Family History  ?Problem Relation Age of Onset  ? Esophageal cancer Mother   ? Heart attack Mother 14  ? Hypertension Mother   ? Bladder Cancer Mother   ? Cancer Mother   ? Lung cancer Father   ?     Long term smoker  ? Heart attack Father 55  ?     MI -> ~12 yrs later --> CABG  ? Hyperlipidemia Father   ? Kidney disease Neg Hx   ? Prostate cancer Neg Hx   ? ? ?Social History  ? ?Socioeconomic History  ? Marital status: Married  ?  Spouse name: Olin Hauser  ? Number of children: 1  ? Years of education: Not on file  ? Highest education level: Some college, no degree  ?Occupational History  ?  Comment: Self Employed - Full time; [employer]  ?Tobacco Use  ? Smoking status: Former  ?  Packs/day: 1.00  ?  Years: 30.00  ?  Pack years: 30.00  ?  Types: Cigarettes  ?  Quit date: 02/25/2016  ?  Years since quitting: 5.3  ? Smokeless tobacco: Former  ?  Quit date: 03/24/2013  ? Tobacco comments:  ?  Smoked off & on for ~20 yrs prior to the last ~10 yr spell  ?Vaping Use  ? Vaping Use: Never used  ?Substance and Sexual Activity  ? Alcohol use: Yes  ?  Alcohol/week: 0.0 standard drinks  ?  Comment: Occasional, when on vacation  ? Drug use: Not Currently  ?  Comment: marijuana many years ago  ? Sexual activity: Yes  ?  Partners: Female  ?Other Topics Concern  ? Not on file  ?Social History Narrative  ? Married Father of 1 daughter.   ? 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 Recently quit smoking (03/24/2013) after ~ 10 yrof ~1/2 ppd.  Prior to that, smoked off & on for ~20+ years.  ? Drinks  occasional social alcohol - moderate; on weekends  ? Occasionally walks / does light exercise.  ?   ?   ? ?Social Determinants of Health  ? ?Financial Resource Strain: Not on file  ?Food Insecurity: Not o

## 2021-07-12 LAB — PSA: PSA: 0.5

## 2021-07-12 LAB — LIPID PANEL
Cholesterol: 174 (ref 0–200)
HDL: 34 — AB (ref 35–70)
LDL Cholesterol: 116
LDl/HDL Ratio: 5.1
Triglycerides: 135 (ref 40–160)

## 2021-07-12 LAB — CBC AND DIFFERENTIAL
HCT: 43 (ref 41–53)
Hemoglobin: 14.6 (ref 13.5–17.5)
Platelets: 222 10*3/uL (ref 150–400)
WBC: 6.6

## 2021-07-12 LAB — BASIC METABOLIC PANEL
BUN: 11 (ref 4–21)
CO2: 20 (ref 13–22)
Chloride: 102 (ref 99–108)
Creatinine: 0.7 (ref 0.6–1.3)
Glucose: 122
Potassium: 4.2 mEq/L (ref 3.5–5.1)
Sodium: 139 (ref 137–147)

## 2021-07-12 LAB — HEPATIC FUNCTION PANEL
ALT: 29 U/L (ref 10–40)
AST: 28 (ref 14–40)
Alkaline Phosphatase: 47 (ref 25–125)
Bilirubin, Total: 0.6

## 2021-07-12 LAB — CBC: RBC: 4.7 (ref 3.87–5.11)

## 2021-07-12 LAB — HEMOGLOBIN A1C: Hemoglobin A1C: 6.9

## 2021-07-12 LAB — COMPREHENSIVE METABOLIC PANEL
Albumin: 4.2 (ref 3.5–5.0)
Calcium: 9.3 (ref 8.7–10.7)
Globulin: 2.9
eGFR: 105

## 2021-07-14 ENCOUNTER — Other Ambulatory Visit: Payer: Self-pay

## 2021-07-14 DIAGNOSIS — Z1211 Encounter for screening for malignant neoplasm of colon: Secondary | ICD-10-CM

## 2021-07-14 MED ORDER — NA SULFATE-K SULFATE-MG SULF 17.5-3.13-1.6 GM/177ML PO SOLN: 1.0000 | Freq: Once | ORAL | 0 refills | Status: AC

## 2021-07-14 NOTE — Progress Notes (Signed)
Gastroenterology Pre-Procedure Review ? ?Request Date: 07/26/2021 ?Requesting Physician: Dr. Vicente Males ? ? ?PATIENT REVIEW QUESTIONS: The patient responded to the following health history questions as indicated:   ? ?1. Are you having any GI issues? no ?2. Do you have a personal history of Polyps? no ?3. Do you have a family history of Colon Cancer or Polyps? no ?4. Diabetes Mellitus? no ?5. Joint replacements in the past 12 months?no ?6. Major health problems in the past 3 months?no ?7. Any artificial heart valves, MVP, or defibrillator?no ?   ?MEDICATIONS & ALLERGIES:    ?Patient reports the following regarding taking any anticoagulation/antiplatelet therapy:   ?Plavix, Coumadin, Eliquis, Xarelto, Lovenox, Pradaxa, Brilinta, or Effient? no ?Aspirin? no ? ?Patient confirms/reports the following medications:  ?Current Outpatient Medications  ?Medication Sig Dispense Refill  ? Aspirin-Salicylamide-Caffeine (BC HEADACHE POWDER PO) Take by mouth as needed.    ? ?No current facility-administered medications for this visit.  ? ? ?Patient confirms/reports the following allergies:  ?Allergies  ?Allergen Reactions  ? Shrimp [Shellfish Allergy]   ?  Itching and rash-Has been tolerating shrimp lately.  ? ? ?No orders of the defined types were placed in this encounter. ? ? ?AUTHORIZATION INFORMATION ?Primary Insurance: ?1D#: ?Group #: ? ?Secondary Insurance: ?1D#: ?Group #: ? ?SCHEDULE INFORMATION: ?Date: 07/26/2021 ?Time: ?Location:[facility] ? ? ?

## 2021-07-25 ENCOUNTER — Encounter: Payer: Self-pay | Admitting: Gastroenterology

## 2021-07-26 ENCOUNTER — Ambulatory Visit: Payer: Managed Care, Other (non HMO) | Admitting: Anesthesiology

## 2021-07-26 ENCOUNTER — Other Ambulatory Visit: Payer: Self-pay

## 2021-07-26 ENCOUNTER — Encounter
Admission: RE | Disposition: A | Discharge status: Home / Self Care | Payer: Self-pay | Source: Home / Self Care | Attending: Gastroenterology

## 2021-07-26 ENCOUNTER — Ambulatory Visit
Admission: RE | Admit: 2021-07-26 | Discharge: 2021-07-26 | Disposition: A | Discharge status: Home / Self Care | Payer: Managed Care, Other (non HMO) | Attending: Gastroenterology | Admitting: Gastroenterology

## 2021-07-26 ENCOUNTER — Encounter: Payer: Self-pay | Admitting: Gastroenterology

## 2021-07-26 DIAGNOSIS — Z1211 Encounter for screening for malignant neoplasm of colon: Secondary | ICD-10-CM

## 2021-07-26 DIAGNOSIS — K635 Polyp of colon: Secondary | ICD-10-CM

## 2021-07-26 DIAGNOSIS — D123 Benign neoplasm of transverse colon: Secondary | ICD-10-CM | POA: Diagnosis not present

## 2021-07-26 DIAGNOSIS — Z87891 Personal history of nicotine dependence: Secondary | ICD-10-CM | POA: Insufficient documentation

## 2021-07-26 DIAGNOSIS — G4733 Obstructive sleep apnea (adult) (pediatric): Secondary | ICD-10-CM | POA: Insufficient documentation

## 2021-07-26 DIAGNOSIS — R519 Headache, unspecified: Secondary | ICD-10-CM | POA: Insufficient documentation

## 2021-07-26 DIAGNOSIS — K640 First degree hemorrhoids: Secondary | ICD-10-CM | POA: Diagnosis not present

## 2021-07-26 DIAGNOSIS — K5730 Diverticulosis of large intestine without perforation or abscess without bleeding: Secondary | ICD-10-CM | POA: Insufficient documentation

## 2021-07-26 DIAGNOSIS — N289 Disorder of kidney and ureter, unspecified: Secondary | ICD-10-CM | POA: Insufficient documentation

## 2021-07-26 HISTORY — PX: COLONOSCOPY WITH PROPOFOL: SHX5780

## 2021-07-26 HISTORY — DX: Personal history of urinary calculi: Z87.442

## 2021-07-26 SURGERY — COLONOSCOPY WITH PROPOFOL
Anesthesia: General

## 2021-07-26 MED ORDER — PROPOFOL 500 MG/50ML IV EMUL
INTRAVENOUS | Status: AC
  Filled 2021-07-26: qty 50

## 2021-07-26 MED ORDER — PROPOFOL 500 MG/50ML IV EMUL
INTRAVENOUS | Status: DC | PRN
Start: 2021-07-26 — End: 2021-07-26
  Administered 2021-07-26: 150 ug/kg/min via INTRAVENOUS

## 2021-07-26 MED ORDER — SODIUM CHLORIDE 0.9 % IV SOLN: INTRAVENOUS | Status: DC

## 2021-07-26 MED ORDER — PROPOFOL 10 MG/ML IV BOLUS
INTRAVENOUS | Status: DC | PRN
  Administered 2021-07-26: 70 mg via INTRAVENOUS

## 2021-07-26 MED ORDER — LIDOCAINE HCL (CARDIAC) PF 100 MG/5ML IV SOSY
PREFILLED_SYRINGE | INTRAVENOUS | Status: DC | PRN
  Administered 2021-07-26: 50 mg via INTRAVENOUS

## 2021-07-26 NOTE — Transfer of Care (Signed)
Immediate Anesthesia Transfer of Care Note ? ?Patient: Edward Bean ? ?Procedure(s) Performed: COLONOSCOPY WITH PROPOFOL ? ?Patient Location: PACU and Endoscopy Unit ? ?Anesthesia Type:General ? ?Level of Consciousness: awake, drowsy and patient cooperative ? ?Airway & Oxygen Therapy: Patient Spontanous Breathing ? ?Post-op Assessment: Report given to RN and Post -op Vital signs reviewed and stable ? ?Post vital signs: Reviewed and stable ? ?Last Vitals:  ?Vitals Value Taken Time  ?BP 116/69 07/26/21 0944  ?Temp    ?Pulse 78 07/26/21 0944  ?Resp 14 07/26/21 0944  ?SpO2 99 % 07/26/21 0944  ?Vitals shown include unvalidated device data. ? ?Last Pain:  ?Vitals:  ? 07/26/21 0903  ?TempSrc: Temporal  ?PainSc: 0-No pain  ?   ? ?  ? ?Complications: No notable events documented. ?

## 2021-07-26 NOTE — Anesthesia Procedure Notes (Signed)
Procedure Name: [place] ?Date/Time: 07/26/2021 9:21 AM ?Performed by: Jerrye Noble, CRNA ?Pre-anesthesia Checklist: Patient identified, Emergency Drugs available, Suction available and Patient being monitored ?Patient Re-evaluated:Patient Re-evaluated prior to induction ?Oxygen Delivery Method: Nasal cannula ? ? ? ? ?

## 2021-07-26 NOTE — H&P (Signed)
? ? ? ?Jonathon Bellows, MD ?[address] [place], [place], [place], [address] ?[address], [place], [place], [place], [address] ?Phone: [phone]  ?Fax: [phone] ? ?Primary Care Physician:  Teodora Medici, DO ? ? ?Pre-Procedure History & Physical: ?HPI:  Edward Bean is a 61 y.o. male is here for an colonoscopy. ?  ?Past Medical History:  ?Diagnosis Date  ? Aortic atherosclerosis ([place]) 06/27/2016  ? Chest CT march 2018  ? Asthma   ? as child  ? Coronary artery disease 06/27/2016  ? Family history of premature CAD 04/02/2013  ? History of kidney stones   ? Hx of tobacco use, presenting hazards to health 05/31/2016  ? Quit Dec 2017  ? Hyperlipidemia LDL goal <130 04/02/2013  ? Hypertension, essential   ? Impaired glucose tolerance in obese   ? Metabolic syndrome   ? Obese; HLD (TG 243), Low HDL (34), HTN  ? Obesity (BMI 30-39.9) 04/02/2013  ? OSA (obstructive sleep apnea)   ? Not currently on CPAP; clinical diagnosis by PCP  ? Pulmonary nodules/lesions, multiple 06/27/2016  ? Chest CT 2018  ? Sleep apnea   ? Smoking greater than 10 pack years 04/02/2013  ? Syncope   ? ? ?Past Surgical History:  ?Procedure Laterality Date  ? COLONOSCOPY    ? GXT/ETT  08/2016  ? Normal LV size. Moderate LVH. EF 60-65%. GR 1 DD. Indeterminate LV filling pressures (less than last echo). Mild aortic stenosis (peak gradient 24 mmHg. MAC. Mild biatrial enlargement.    ? KNEE SURGERY    ? Extensive L Knee surgery  ? Treadmill Myoview  03/2013  ? Exercise 10 min; 12 METS (excellent exercise tolerance)) - read 169 bpm (101% of max predicted) - no ECG or scintigraphic evidence of ischemia or infarction  ? ? ?Prior to Admission medications   ?Medication Sig Start Date End Date Taking? Authorizing Provider  ?Aspirin-Salicylamide-Caffeine (BC HEADACHE POWDER PO) Take by mouth as needed.   Yes [provider]  ? ? ?Allergies as of 07/14/2021 - Review Complete 07/14/2021  ?Allergen Reaction Noted  ? Shrimp [shellfish allergy]   04/02/2013  ? ? ?Family History  ?Problem Relation Age of Onset  ? Esophageal cancer Mother   ? Heart attack Mother 78  ? Hypertension Mother   ? Bladder Cancer Mother   ? Cancer Mother   ? Lung cancer Father   ?     Long term smoker  ? Heart attack Father 37  ?     MI -> ~12 yrs later --> CABG  ? Hyperlipidemia Father   ? Kidney disease Neg Hx   ? Prostate cancer Neg Hx   ? ? ?Social History  ? ?Socioeconomic History  ? Marital status: Married  ?  Spouse name: Olin Hauser  ? Number of children: 1  ? Years of education: Not on file  ? Highest education level: Some college, no degree  ?Occupational History  ?  Comment: Self Employed - Full time; [employer]  ?Tobacco Use  ? Smoking status: Former  ?  Packs/day: 1.00  ?  Years: 30.00  ?  Pack years: 30.00  ?  Types: Cigarettes  ?  Quit date: 02/25/2016  ?  Years since quitting: 5.4  ? Smokeless tobacco: Former  ?  Quit date: 03/24/2013  ? Tobacco comments:  ?  Smoked off & on for ~20 yrs prior to the last ~10 yr spell  ?Vaping Use  ? Vaping Use: Never used  ?Substance  and Sexual Activity  ? Alcohol use: Yes  ?  Alcohol/week: 0.0 standard drinks  ?  Comment: Occasional, when on vacation  ? Drug use: Not Currently  ?  Comment: marijuana many years ago  ? Sexual activity: Yes  ?  Partners: Female  ?Other Topics Concern  ? Not on file  ?Social History Narrative  ? Married Father of 1 daughter.   ? 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 Recently quit smoking (03/24/2013) after ~ 10 yrof ~1/2 ppd.  Prior to that, smoked off & on for ~20+ years.  ? Drinks occasional social alcohol - moderate; on weekends  ? Occasionally walks / does light exercise.  ?   ?   ? ?Social Determinants of Health  ? ?Financial Resource Strain: Unknown  ? Difficulty of Paying Living Expenses: Patient refused  ?Food Insecurity: Unknown  ? Worried About Charity fundraiser in the Last Year: Patient refused  ? Ran Out of Food in the Last Year: Patient refused  ?Transportation Needs: Unknown  ? Lack of  Transportation (Medical): Patient refused  ? Lack of Transportation (Non-Medical): Patient refused  ?Physical Activity: Insufficiently Active  ? Days of Exercise per Week: 2 days  ? Minutes of Exercise per Session: 10 min  ?Stress: No Stress Concern Present  ? Feeling of Stress : Only a little  ?Social Connections: Unknown  ? Frequency of Communication with Friends and Family: Three times a week  ? Frequency of Social Gatherings with Friends and Family: Three times a week  ? Attends Religious Services: 1 to 4 times per year  ? Active Member of Clubs or Organizations: Patient refused  ? Attends Archivist Meetings: Patient refused  ? Marital Status: Married  ?Intimate Partner Violence: Not At Risk  ? Fear of Current or Ex-Partner: No  ? Emotionally Abused: No  ? Physically Abused: No  ? Sexually Abused: No  ? ? ?Review of Systems: ?See HPI, otherwise negative ROS ? ?Physical Exam: ?BP (!) 147/100   Pulse 79   Temp (!) 97 ?F (36.1 ?C) (Temporal)   Resp 18   Ht _0  (1.803 m)   Wt 108.9 kg   SpO2 97%   BMI 33.47 kg/m?  ?General:   Alert,  pleasant and cooperative in NAD ?Head:  Normocephalic and atraumatic. ?Neck:  Supple; no masses or thyromegaly. ?Lungs:  Clear throughout to auscultation, normal respiratory effort.    ?Heart:  +S1, +S2, Regular rate and rhythm, No edema. ?Abdomen:  Soft, nontender and nondistended. Normal bowel sounds, without guarding, and without rebound.   ?Neurologic:  Alert and  oriented x4;  grossly normal neurologically. ? ?Impression/Plan: ?Edward Bean is here for an colonoscopy to be performed for Screening colonoscopy average risk   ?Risks, benefits, limitations, and alternatives regarding  colonoscopy have been reviewed with the patient.  Questions have been answered.  All parties agreeable. ? ? ?Jonathon Bellows, MD  07/26/2021, 9:17 AM ? ?

## 2021-07-26 NOTE — Anesthesia Preprocedure Evaluation (Signed)
Anesthesia Evaluation  ?Patient identified by MRN, date of birth, ID band ?Patient awake ? ? ? ?Reviewed: ?Allergy & Precautions, NPO status , Patient's Chart, lab work & pertinent test results ? ?History of Anesthesia Complications ?Negative for: history of anesthetic complications ? ?Airway ?Mallampati: III ? ?TM Distance: <3 FB ?Neck ROM: full ? ? ? Dental ? ?(+) Chipped ?  ?Pulmonary ?asthma , sleep apnea , former smoker,  ?  ?Pulmonary exam normal ? ? ? ? ? ? ? Cardiovascular ?Exercise Tolerance: Good ?hypertension, (-) angina+ CAD  ?Normal cardiovascular exam ? ? ?  ?Neuro/Psych ? Headaches, negative psych ROS  ? GI/Hepatic ?negative GI ROS, Neg liver ROS, neg GERD  ,  ?Endo/Other  ?negative endocrine ROS ? Renal/GU ?Renal disease  ?negative genitourinary ?  ?Musculoskeletal ? ? Abdominal ?  ?Peds ? Hematology ?negative hematology ROS ?(+)   ?Anesthesia Other Findings ?Past Medical History: ?06/27/2016: Aortic atherosclerosis ([place]) ?    Comment:  Chest CT march 2018 ?No date: Asthma ?    Comment:  as child ?06/27/2016: Coronary artery disease ?04/02/2013: Family history of premature CAD ?No date: History of kidney stones ?05/31/2016: Hx of tobacco use, presenting hazards to health ?    Comment:  Quit Dec 2017 ?04/02/2013: Hyperlipidemia LDL goal <130 ?No date: Hypertension, essential ?No date: Impaired glucose tolerance in obese ?No date: Metabolic syndrome ?    Comment:  Obese; HLD (TG 243), Low HDL (34), HTN ?04/02/2013: Obesity (BMI 30-39.9) ?No date: OSA (obstructive sleep apnea) ?    Comment:  Not currently on CPAP; clinical diagnosis by PCP ?06/27/2016: Pulmonary nodules/lesions, multiple ?    Comment:  Chest CT 2018 ?No date: Sleep apnea ?04/02/2013: Smoking greater than 10 pack years ?No date: Syncope ? ?Past Surgical History: ?No date: COLONOSCOPY ?08/2016: GXT/ETT ?    Comment:  Normal LV size. Moderate LVH. EF 60-65%. GR 1 DD.  ?             Indeterminate LV filling  pressures (less than last echo). ?             Mild aortic stenosis (peak gradient 24 mmHg. MAC. Mild  ?             biatrial enlargement.   ?No date: KNEE SURGERY ?    Comment:  Extensive L Knee surgery ?03/2013: Treadmill Myoview ?    Comment:  Exercise 10 min; 12 METS (excellent exercise tolerance)) ?             - read 169 bpm (101% of max predicted) - no ECG or  ?             scintigraphic evidence of ischemia or infarction ? ? ? ? Reproductive/Obstetrics ?negative OB ROS ? ?  ? ? ? ? ? ? ? ? ? ? ? ? ? ?  ?  ? ? ? ? ? ? ? ? ?Anesthesia Physical ?Anesthesia Plan ? ?ASA: 3 ? ?Anesthesia Plan: General  ? ?Post-op Pain Management:   ? ?Induction: Intravenous ? ?PONV Risk Score and Plan: Propofol infusion and TIVA ? ?Airway Management Planned: Natural Airway and Nasal Cannula ? ?Additional Equipment:  ? ?Intra-op Plan:  ? ?Post-operative Plan:  ? ?Informed Consent: I have reviewed the patients History and Physical, chart, labs and discussed the procedure including the risks, benefits and alternatives for the proposed anesthesia with the patient or authorized representative who has indicated his/her understanding and acceptance.  ? ? ? ?Dental Advisory Given ? ?  Plan Discussed with: Anesthesiologist, CRNA and Surgeon ? ?Anesthesia Plan Comments: (Patient consented for risks of anesthesia including but not limited to:  ?- adverse reactions to medications ?- risk of airway placement if required ?- damage to eyes, teeth, lips or other oral mucosa ?- nerve damage due to positioning  ?- sore throat or hoarseness ?- Damage to heart, brain, nerves, lungs, other parts of body or loss of life ? ?Patient voiced understanding.)  ? ? ? ? ? ? ?Anesthesia Quick Evaluation ? ?

## 2021-07-26 NOTE — Anesthesia Postprocedure Evaluation (Signed)
Anesthesia Post Note ? ?Patient: BAPTISTE LITTLER ? ?Procedure(s) Performed: COLONOSCOPY WITH PROPOFOL ? ?Patient location during evaluation: Endoscopy ?Anesthesia Type: General ?Level of consciousness: awake and alert ?Pain management: pain level controlled ?Vital Signs Assessment: post-procedure vital signs reviewed and stable ?Respiratory status: spontaneous breathing, nonlabored ventilation, respiratory function stable and patient connected to nasal cannula oxygen ?Cardiovascular status: blood pressure returned to baseline and stable ?Postop Assessment: no apparent nausea or vomiting ?Anesthetic complications: no ? ? ?No notable events documented. ? ? ?Last Vitals:  ?Vitals:  ? 07/26/21 0954 07/26/21 1004  ?BP: (!) 129/91 (!) 128/94  ?Pulse: 85 71  ?Resp: 18 (!) 5  ?Temp:    ?SpO2: 98% 98%  ?  ?Last Pain:  ?Vitals:  ? 07/26/21 0954  ?TempSrc:   ?PainSc: 0-No pain  ? ? ?  ?  ?  ?  ?  ?  ? ?Precious Haws Brandn Mcgath ? ? ? ? ?

## 2021-07-26 NOTE — Op Note (Signed)
[facility] ?Gastroenterology ?Patient Name: Edward Bean ?Procedure Date: 07/26/2021 9:10 AM ?MRN: [id number] ?Account #: [id number] ?Date of Birth: [birth year] ?Admit Type: Outpatient ?Age: 61 ?Room: [facility] ENDO ROOM 2 ?Gender: Male ?Note Status: Finalized ?Instrument Name: Colonoscope [id number] ?Procedure:             Colonoscopy ?Indications:           Screening for colorectal malignant neoplasm ?Providers:             Jonathon Bellows MD, MD ?Medicines:             Monitored Anesthesia Care ?Complications:         No immediate complications. ?Procedure:             Pre-Anesthesia Assessment: ?                       - Prior to the procedure, a History and Physical was  ?                       performed, and patient medications, allergies and  ?                       sensitivities were reviewed. The patient's tolerance  ?                       of previous anesthesia was reviewed. ?                       - The risks and benefits of the procedure and the  ?                       sedation options and risks were discussed with the  ?                       patient. All questions were answered and informed  ?                       consent was obtained. ?                       - ASA Grade Assessment: II - A patient with mild  ?                       systemic disease. ?                       After obtaining informed consent, the colonoscope was  ?                       passed under direct vision. Throughout the procedure,  ?                       the patient's blood pressure, pulse, and oxygen  ?                       saturations were monitored continuously. The  ?                       Colonoscope was introduced through the anus and  ?  advanced to the the cecum, identified by the  ?                       appendiceal orifice. The colonoscopy was performed  ?                       with ease. The patient tolerated the procedure well.  ?                       The quality of the bowel preparation  was excellent. ?Findings: ?     The perianal and digital rectal examinations were normal. ?     Two sessile polyps were found in the transverse colon. The polyps were 5  ?     to 6 mm in size. These polyps were removed with a cold snare. Resection  ?     and retrieval were complete. ?     Multiple small-mouthed diverticula were found in the sigmoid colon. ?     Non-bleeding internal hemorrhoids were found during retroflexion. The  ?     hemorrhoids were large and Grade I (internal hemorrhoids that do not  ?     prolapse). ?     The exam was otherwise without abnormality on direct and retroflexion  ?     views. ?Impression:            - Two 5 to 6 mm polyps in the transverse colon,  ?                       removed with a cold snare. Resected and retrieved. ?                       - Diverticulosis in the sigmoid colon. ?                       - Non-bleeding internal hemorrhoids. ?                       - The examination was otherwise normal on direct and  ?                       retroflexion views. ?Recommendation:        - Discharge patient to home (with escort). ?                       - Resume previous diet. ?                       - Continue present medications. ?                       - Await pathology results. ?                       - Repeat colonoscopy date to be determined after  ?                       pending pathology results are reviewed for  ?                       surveillance. ?Procedure Code(s):     --- Professional --- ?  45385, Colonoscopy, flexible; with removal of  ?                       tumor(s), polyp(s), or other lesion(s) by snare  ?                       technique ?Diagnosis Code(s):     --- Professional --- ?                       Z12.11, Encounter for screening for malignant neoplasm  ?                       of colon ?                       K63.5, Polyp of colon ?                       K64.0, First degree hemorrhoids ?                       K57.30, Diverticulosis of  large intestine without  ?                       perforation or abscess without bleeding ?CPT copyright 2019 [employer]. All rights reserved. ?The codes documented in this report are preliminary and upon coder review may  ?be revised to meet current compliance requirements. ?Jonathon Bellows, MD ?Jonathon Bellows MD, MD ?07/26/2021 9:44:19 AM ?This report has been signed electronically. ?Number of Addenda: 0 ?Note Initiated On: 07/26/2021 9:10 AM ?Scope Withdrawal Time: 0 hours 11 minutes 15 seconds  ?Total Procedure Duration: 0 hours 12 minutes 28 seconds  ?Estimated Blood Loss:  Estimated blood loss: none. ?     [facility] ?

## 2021-07-27 ENCOUNTER — Encounter: Payer: Self-pay | Admitting: Gastroenterology

## 2021-07-27 LAB — SURGICAL PATHOLOGY

## 2021-08-15 ENCOUNTER — Ambulatory Visit: Payer: Managed Care, Other (non HMO) | Admitting: Internal Medicine

## 2022-04-21 ENCOUNTER — Ambulatory Visit: Payer: Managed Care, Other (non HMO)

## 2022-04-21 ENCOUNTER — Ambulatory Visit
Admission: RE | Admit: 2022-04-21 | Discharge: 2022-04-21 | Disposition: A | Discharge status: Home / Self Care | Payer: Managed Care, Other (non HMO) | Source: Ambulatory Visit | Attending: Acute Care | Admitting: Acute Care

## 2022-04-21 DIAGNOSIS — Z87891 Personal history of nicotine dependence: Secondary | ICD-10-CM

## 2022-04-24 ENCOUNTER — Other Ambulatory Visit: Payer: Self-pay | Admitting: Acute Care

## 2022-04-24 DIAGNOSIS — Z122 Encounter for screening for malignant neoplasm of respiratory organs: Secondary | ICD-10-CM

## 2022-04-24 DIAGNOSIS — Z87891 Personal history of nicotine dependence: Secondary | ICD-10-CM

## 2023-04-24 ENCOUNTER — Ambulatory Visit
Admission: RE | Admit: 2023-04-24 | Discharge: 2023-04-24 | Disposition: A | Discharge status: Home / Self Care | Payer: Managed Care, Other (non HMO) | Source: Ambulatory Visit | Attending: Acute Care | Admitting: Acute Care

## 2023-04-24 ENCOUNTER — Other Ambulatory Visit: Payer: Managed Care, Other (non HMO)

## 2023-04-24 DIAGNOSIS — Z122 Encounter for screening for malignant neoplasm of respiratory organs: Secondary | ICD-10-CM

## 2023-04-24 DIAGNOSIS — Z87891 Personal history of nicotine dependence: Secondary | ICD-10-CM

## 2023-05-04 ENCOUNTER — Other Ambulatory Visit: Payer: Self-pay

## 2023-05-04 DIAGNOSIS — Z87891 Personal history of nicotine dependence: Secondary | ICD-10-CM

## 2023-05-04 DIAGNOSIS — Z122 Encounter for screening for malignant neoplasm of respiratory organs: Secondary | ICD-10-CM

## 2024-04-24 ENCOUNTER — Ambulatory Visit
Admission: RE | Admit: 2024-04-24 | Discharge: 2024-04-24 | Disposition: A | Discharge status: Home / Self Care | Source: Ambulatory Visit | Attending: Acute Care | Admitting: Acute Care

## 2024-04-24 DIAGNOSIS — Z87891 Personal history of nicotine dependence: Secondary | ICD-10-CM

## 2024-04-24 DIAGNOSIS — Z122 Encounter for screening for malignant neoplasm of respiratory organs: Secondary | ICD-10-CM

## 2024-04-29 ENCOUNTER — Other Ambulatory Visit: Payer: Self-pay

## 2024-04-29 DIAGNOSIS — Z87891 Personal history of nicotine dependence: Secondary | ICD-10-CM

## 2024-04-29 DIAGNOSIS — Z122 Encounter for screening for malignant neoplasm of respiratory organs: Secondary | ICD-10-CM
